# Patient Record
Sex: Female | Born: 1990 | Race: White | Hispanic: No | Marital: Single | Smoking: Never smoker
Health system: Southern US, Community
[De-identification: ages and names within clinical notes are randomized; demographics above are authoritative.]

## PROBLEM LIST (undated history)

## (undated) ENCOUNTER — Inpatient Hospital Stay: Payer: Self-pay

## (undated) DIAGNOSIS — F32A Depression, unspecified: Secondary | ICD-10-CM

## (undated) DIAGNOSIS — K219 Gastro-esophageal reflux disease without esophagitis: Secondary | ICD-10-CM

## (undated) DIAGNOSIS — J45909 Unspecified asthma, uncomplicated: Secondary | ICD-10-CM

## (undated) DIAGNOSIS — F419 Anxiety disorder, unspecified: Secondary | ICD-10-CM

## (undated) DIAGNOSIS — F329 Major depressive disorder, single episode, unspecified: Secondary | ICD-10-CM

## (undated) HISTORY — PX: WISDOM TOOTH EXTRACTION: SHX21

---

## 2012-10-01 ENCOUNTER — Ambulatory Visit: Payer: Self-pay | Admitting: Medical

## 2012-10-01 LAB — RAPID STREP-A WITH REFLX: Micro Text Report: NEGATIVE

## 2012-10-04 LAB — BETA STREP CULTURE(ARMC)

## 2014-12-16 ENCOUNTER — Ambulatory Visit: Payer: Self-pay | Admitting: Nurse Practitioner

## 2015-02-24 ENCOUNTER — Ambulatory Visit
Admit: 2015-02-24 | Disposition: A | Discharge status: Home / Self Care | Payer: Self-pay | Attending: Nurse Practitioner | Admitting: Nurse Practitioner

## 2015-05-27 ENCOUNTER — Observation Stay
Admission: EM | Admit: 2015-05-27 | Discharge: 2015-05-27 | Disposition: A | Discharge status: Home / Self Care | Payer: Medicaid Other | Attending: Obstetrics and Gynecology | Admitting: Obstetrics and Gynecology

## 2015-05-27 MED ORDER — LACTATED RINGERS IV SOLN: 500.0000 mL | INTRAVENOUS | Status: DC | PRN

## 2015-05-27 NOTE — OB Triage Note (Signed)
Pt. reports Braxton Hicks contractions that started several weeks ago and have intensified somewhat; now occurring sporadically. Feeling radiating upper abdominal tightness and increasing pressure. Denies bleeding or "major" LOF. Nitrazine negative.

## 2015-06-14 ENCOUNTER — Observation Stay
Admission: EM | Admit: 2015-06-14 | Discharge: 2015-06-14 | Disposition: A | Discharge status: Home / Self Care | Payer: Medicaid Other | Attending: Obstetrics and Gynecology | Admitting: Obstetrics and Gynecology

## 2015-06-14 DIAGNOSIS — O26893 Other specified pregnancy related conditions, third trimester: Principal | ICD-10-CM | POA: Insufficient documentation

## 2015-06-14 DIAGNOSIS — Z3A36 36 weeks gestation of pregnancy: Secondary | ICD-10-CM | POA: Insufficient documentation

## 2015-06-14 DIAGNOSIS — O26899 Other specified pregnancy related conditions, unspecified trimester: Secondary | ICD-10-CM

## 2015-06-14 DIAGNOSIS — R109 Unspecified abdominal pain: Secondary | ICD-10-CM | POA: Insufficient documentation

## 2015-06-14 HISTORY — DX: Anxiety disorder, unspecified: F41.9

## 2015-06-14 HISTORY — DX: Depression, unspecified: F32.A

## 2015-06-14 HISTORY — DX: Major depressive disorder, single episode, unspecified: F32.9

## 2015-06-14 NOTE — ED Notes (Signed)
Call to L&D, patient to room Obs 3. Pt transported to Obs 3.

## 2015-06-14 NOTE — OB Triage Note (Signed)
23 y.o. female presents today with complaint of losing mucus plug and belly pain .  States that this started last night. This has been going on for one day. States that this is a 5/10 on pain scale. She states that resting makes it better and moving makes it worse. At home has tried Tylenol to make it better.

## 2015-06-14 NOTE — OB Triage Provider Note (Signed)
TRIAGE VISIT with NST   Kellie Mejia is a 24 y.o. G1P0000. She is at [redacted]w[redacted]d gestation, presenting with signs of labor.  Indication: Contractions, lost mucus plug  S: Resting comfortably. Rare CTX, no VB. Active fetal movement. Concerned about being hot.  O:  BP 120/86 mmHg  Pulse 96  Ht  (1.626 m)  Wt 87.998 kg (194 lb)  BMI 33.28 kg/m2  LMP 10/01/2014 No results found for this or any previous visit (from the past 48 hour(s)).   Gen: NAD, AAOx3      Abd: FNTTP      Ext: Non-tender, Nonedmeatous    FHT: 120, mod var, +accels, no decels TOCO: quiet SVE: closed, thick and posterior  NST: Reactive. See FHT above for particulars.  A/P:  24 y.o. G1P0000 [redacted]w[redacted]d with lost mucus plug and concerns for labor.   Labor: not present.   Fetal Wellbeing: Reassuring Cat 1 tracing.  D/c home stable, precautions reviewed, follow-up as scheduled.

## 2015-07-01 ENCOUNTER — Encounter: Payer: Self-pay | Admitting: *Deleted

## 2015-07-01 ENCOUNTER — Observation Stay
Admission: EM | Admit: 2015-07-01 | Discharge: 2015-07-01 | Disposition: A | Discharge status: Home / Self Care | Payer: Medicaid Other | Attending: Obstetrics and Gynecology | Admitting: Obstetrics and Gynecology

## 2015-07-01 DIAGNOSIS — Z3A39 39 weeks gestation of pregnancy: Secondary | ICD-10-CM | POA: Diagnosis not present

## 2015-07-01 DIAGNOSIS — R102 Pelvic and perineal pain: Secondary | ICD-10-CM | POA: Diagnosis present

## 2015-07-01 DIAGNOSIS — O26893 Other specified pregnancy related conditions, third trimester: Secondary | ICD-10-CM | POA: Diagnosis not present

## 2015-07-01 DIAGNOSIS — R109 Unspecified abdominal pain: Secondary | ICD-10-CM | POA: Diagnosis present

## 2015-07-01 MED ORDER — ACETAMINOPHEN 325 MG PO TABS: 650.0000 mg | ORAL_TABLET | ORAL | Status: DC | PRN

## 2015-07-01 NOTE — Progress Notes (Signed)
Patient ID: Kellie Mejia, female   DOB: 1991-06-12, 24 y.o.   MRN: [id number] [name] Sep 17, 1991 G1 P0 [redacted]w[redacted]d presents for pelvic pressure and pain after walking yesterday  noLOF , no vaginal bleeding , O;Temp(Src) 98.6 F (37 C) (Oral)  Resp 18  Ht  (1.626 m)  Wt 201 lb (91.173 kg)  BMI 34.48 kg/m2  LMP 10/01/2014 ABDsoft NT CX tft by rn NST REACTIVE nst Labs: N/A A: ABD PAIN  No evidence of CTX / labor , reassuring fetal monitoring  P:d/c home  Precautions given

## 2015-07-01 NOTE — OB Triage Note (Signed)
patient states she went for a walk last night and has been hurting ever since.

## 2015-07-01 NOTE — Discharge Instructions (Signed)
Drink plenty of fluid and get plenty of rest. Call your provider for any other concerns °

## 2015-07-01 NOTE — Discharge Summary (Signed)
Patient discharged home, discharge instructions given, patient states understanding. Patient left floor in stable condition, denies any other needs at this time. Patient to keep next scheduled OB appointment 

## 2015-07-10 ENCOUNTER — Observation Stay
Admission: EM | Admit: 2015-07-10 | Discharge: 2015-07-10 | Disposition: A | Discharge status: Home / Self Care | Payer: Medicaid Other | Attending: Obstetrics and Gynecology | Admitting: Obstetrics and Gynecology

## 2015-07-10 ENCOUNTER — Encounter: Payer: Self-pay | Admitting: *Deleted

## 2015-07-10 DIAGNOSIS — O0993 Supervision of high risk pregnancy, unspecified, third trimester: Secondary | ICD-10-CM

## 2015-07-10 DIAGNOSIS — Z3490 Encounter for supervision of normal pregnancy, unspecified, unspecified trimester: Secondary | ICD-10-CM

## 2015-07-10 DIAGNOSIS — Z3A40 40 weeks gestation of pregnancy: Secondary | ICD-10-CM | POA: Diagnosis not present

## 2015-07-10 HISTORY — DX: Unspecified asthma, uncomplicated: J45.909

## 2015-07-10 HISTORY — DX: Gastro-esophageal reflux disease without esophagitis: K21.9

## 2015-07-10 NOTE — OB Triage Note (Signed)
Complaining of abdominal pressure starting around 3 PM yesterday. No leaking fluid, vaginal bleeding, or decreased movement.

## 2015-07-10 NOTE — OB Triage Provider Note (Signed)
TRIAGE VISIT with NST    Kellie Mejia is a 24 y.o. G1P0000. She is at [redacted]w[redacted]d gestation, presenting with signs of labor.  See RN note for further details  O: NST reviewed    EFM: 130 mod var, +accels, no decels Toco: 1 ctx  Per RN, 1cm/long/post  A/P:  24 y.o. G1P0000 40+2wk with early labor   Fetal Wellbeing: Reactive tracing.  D/c home stable, precautions reviewed, follow-up as scheduled.   PD IOL scheduled for Aug 25  Ala Dach, MD

## 2015-07-10 NOTE — Discharge Summary (Signed)
Reviewed discharge instructions with patient and significant other, both verbalized understanding. Copy of instructions given to patient including signs of labor and reasons to return to ER.

## 2015-07-16 ENCOUNTER — Encounter: Payer: Self-pay | Admitting: *Deleted

## 2015-07-16 ENCOUNTER — Inpatient Hospital Stay
Admission: RE | Admit: 2015-07-16 | Discharge: 2015-07-20 | DRG: 766 | Disposition: A | Discharge status: Home / Self Care | Payer: Medicaid Other | Attending: Obstetrics and Gynecology | Admitting: Obstetrics and Gynecology

## 2015-07-16 DIAGNOSIS — Z8249 Family history of ischemic heart disease and other diseases of the circulatory system: Secondary | ICD-10-CM | POA: Diagnosis not present

## 2015-07-16 DIAGNOSIS — O99344 Other mental disorders complicating childbirth: Secondary | ICD-10-CM | POA: Diagnosis present

## 2015-07-16 DIAGNOSIS — O9962 Diseases of the digestive system complicating childbirth: Secondary | ICD-10-CM | POA: Diagnosis present

## 2015-07-16 DIAGNOSIS — K219 Gastro-esophageal reflux disease without esophagitis: Secondary | ICD-10-CM | POA: Diagnosis present

## 2015-07-16 DIAGNOSIS — F419 Anxiety disorder, unspecified: Secondary | ICD-10-CM | POA: Diagnosis present

## 2015-07-16 DIAGNOSIS — O9952 Diseases of the respiratory system complicating childbirth: Secondary | ICD-10-CM | POA: Diagnosis present

## 2015-07-16 DIAGNOSIS — J45909 Unspecified asthma, uncomplicated: Secondary | ICD-10-CM | POA: Diagnosis present

## 2015-07-16 DIAGNOSIS — Z6835 Body mass index (BMI) 35.0-35.9, adult: Secondary | ICD-10-CM | POA: Diagnosis not present

## 2015-07-16 DIAGNOSIS — Z9889 Other specified postprocedural states: Secondary | ICD-10-CM

## 2015-07-16 DIAGNOSIS — Z3A41 41 weeks gestation of pregnancy: Secondary | ICD-10-CM | POA: Diagnosis present

## 2015-07-16 DIAGNOSIS — E669 Obesity, unspecified: Secondary | ICD-10-CM | POA: Diagnosis present

## 2015-07-16 DIAGNOSIS — O480 Post-term pregnancy: Secondary | ICD-10-CM | POA: Diagnosis present

## 2015-07-16 DIAGNOSIS — F329 Major depressive disorder, single episode, unspecified: Secondary | ICD-10-CM | POA: Diagnosis present

## 2015-07-16 LAB — OB RESULTS CONSOLE GC/CHLAMYDIA
Chlamydia: NEGATIVE
Gonorrhea: NEGATIVE

## 2015-07-16 LAB — OB RESULTS CONSOLE ABO/RH: RH TYPE: POSITIVE

## 2015-07-16 LAB — OB RESULTS CONSOLE RPR: RPR: NONREACTIVE

## 2015-07-16 LAB — OB RESULTS CONSOLE GBS: STREP GROUP B AG: NEGATIVE

## 2015-07-16 LAB — OB RESULTS CONSOLE HIV ANTIBODY (ROUTINE TESTING): HIV: NONREACTIVE

## 2015-07-16 LAB — OB RESULTS CONSOLE VARICELLA ZOSTER ANTIBODY, IGG: VARICELLA IGG: IMMUNE

## 2015-07-16 LAB — OB RESULTS CONSOLE HEPATITIS B SURFACE ANTIGEN: HEP B S AG: NEGATIVE

## 2015-07-16 LAB — OB RESULTS CONSOLE RUBELLA ANTIBODY, IGM: Rubella: IMMUNE

## 2015-07-16 MED ORDER — CITRIC ACID-SODIUM CITRATE 334-500 MG/5ML PO SOLN
30.0000 mL | ORAL | Status: DC | PRN
Start: 2015-07-16 — End: 2015-07-17
  Administered 2015-07-17: 30 mL via ORAL

## 2015-07-16 MED ORDER — LACTATED RINGERS IV SOLN: 500.0000 mL | INTRAVENOUS | Status: DC | PRN

## 2015-07-16 MED ORDER — BUTORPHANOL TARTRATE 1 MG/ML IJ SOLN
1.0000 mg | INTRAMUSCULAR | Status: DC | PRN
  Administered 2015-07-17: 2 mg via INTRAVENOUS

## 2015-07-16 MED ORDER — TERBUTALINE SULFATE 1 MG/ML IJ SOLN: 0.2500 mg | Freq: Once | INTRAMUSCULAR | Status: DC | PRN

## 2015-07-16 MED ORDER — ONDANSETRON HCL 4 MG/2ML IJ SOLN
4.0000 mg | Freq: Four times a day (QID) | INTRAMUSCULAR | Status: DC | PRN
Start: 2015-07-16 — End: 2015-07-17

## 2015-07-16 MED ORDER — LIDOCAINE HCL (PF) 1 % IJ SOLN: 30.0000 mL | INTRAMUSCULAR | Status: DC | PRN

## 2015-07-16 MED ORDER — OXYTOCIN 40 UNITS IN LACTATED RINGERS INFUSION - SIMPLE MED
62.5000 mL/h | INTRAVENOUS | Status: DC
  Filled 2015-07-16: qty 1000

## 2015-07-16 MED ORDER — LACTATED RINGERS IV SOLN
INTRAVENOUS | Status: DC
  Administered 2015-07-17: 125 mL/h via INTRAVENOUS
  Administered 2015-07-17: 17:00:00 via INTRAVENOUS

## 2015-07-16 MED ORDER — ACETAMINOPHEN 325 MG PO TABS: 650.0000 mg | ORAL_TABLET | ORAL | Status: DC | PRN

## 2015-07-16 MED ORDER — DINOPROSTONE 10 MG VA INST
10.0000 mg | VAGINAL_INSERT | Freq: Once | VAGINAL | Status: AC
  Administered 2015-07-17: 10 mg via VAGINAL
  Filled 2015-07-16: qty 1

## 2015-07-16 MED ORDER — OXYTOCIN BOLUS FROM INFUSION: 250.0000 mL | INTRAVENOUS | Status: DC

## 2015-07-17 ENCOUNTER — Encounter: Payer: Self-pay | Admitting: Anesthesiology

## 2015-07-17 ENCOUNTER — Inpatient Hospital Stay: Payer: Medicaid Other | Admitting: Anesthesiology

## 2015-07-17 ENCOUNTER — Encounter
Admission: RE | Disposition: A | Discharge status: Home / Self Care | Payer: Self-pay | Source: Home / Self Care | Attending: Obstetrics and Gynecology

## 2015-07-17 ENCOUNTER — Other Ambulatory Visit: Payer: Self-pay | Admitting: Obstetrics and Gynecology

## 2015-07-17 DIAGNOSIS — Z9889 Other specified postprocedural states: Secondary | ICD-10-CM

## 2015-07-17 LAB — CBC
HCT: 36.5 % (ref 35.0–47.0)
HEMOGLOBIN: 12.2 g/dL (ref 12.0–16.0)
MCH: 29.1 pg (ref 26.0–34.0)
MCHC: 33.5 g/dL (ref 32.0–36.0)
MCV: 86.8 fL (ref 80.0–100.0)
PLATELETS: 245 10*3/uL (ref 150–440)
RBC: 4.2 MIL/uL (ref 3.80–5.20)
RDW: 13.7 % (ref 11.5–14.5)
WBC: 13.6 10*3/uL — AB (ref 3.6–11.0)

## 2015-07-17 LAB — TYPE AND SCREEN
ABO/RH(D): A POS
Antibody Screen: NEGATIVE

## 2015-07-17 LAB — CHLAMYDIA/NGC RT PCR (ARMC ONLY)
CHLAMYDIA TR: NOT DETECTED
N gonorrhoeae: NOT DETECTED

## 2015-07-17 SURGERY — Surgical Case
Anesthesia: Epidural | Site: Abdomen | Wound class: Clean Contaminated

## 2015-07-17 MED ORDER — DIBUCAINE 1 % RE OINT: 1.0000 "application " | TOPICAL_OINTMENT | RECTAL | Status: DC | PRN

## 2015-07-17 MED ORDER — ONDANSETRON 4 MG PO TBDP
4.0000 mg | ORAL_TABLET | Freq: Four times a day (QID) | ORAL | Status: DC | PRN
  Filled 2015-07-17: qty 1

## 2015-07-17 MED ORDER — METHYLERGONOVINE MALEATE 0.2 MG/ML IJ SOLN
INTRAMUSCULAR | Status: AC
  Filled 2015-07-17: qty 1

## 2015-07-17 MED ORDER — TERBUTALINE SULFATE 1 MG/ML IJ SOLN
INTRAMUSCULAR | Status: AC
  Filled 2015-07-17: qty 1

## 2015-07-17 MED ORDER — FENTANYL 2.5 MCG/ML W/ROPIVACAINE 0.2% IN NS 100 ML EPIDURAL INFUSION (ARMC-ANES)
EPIDURAL | Status: DC | PRN
  Administered 2015-07-17: 9 mL/h via EPIDURAL

## 2015-07-17 MED ORDER — FENTANYL CITRATE (PF) 100 MCG/2ML IJ SOLN: 25.0000 ug | INTRAMUSCULAR | Status: DC | PRN

## 2015-07-17 MED ORDER — CEFAZOLIN SODIUM-DEXTROSE 2-3 GM-% IV SOLR
INTRAVENOUS | Status: AC
  Administered 2015-07-17: 2000 mg
  Filled 2015-07-17: qty 50

## 2015-07-17 MED ORDER — BUPIVACAINE HCL (PF) 0.5 % IJ SOLN
INTRAMUSCULAR | Status: AC
  Filled 2015-07-17: qty 30

## 2015-07-17 MED ORDER — ONDANSETRON HCL 4 MG/2ML IJ SOLN: 4.0000 mg | Freq: Four times a day (QID) | INTRAMUSCULAR | Status: DC | PRN

## 2015-07-17 MED ORDER — ACETAMINOPHEN 325 MG PO TABS: 650.0000 mg | ORAL_TABLET | ORAL | Status: DC | PRN

## 2015-07-17 MED ORDER — BUPIVACAINE HCL 0.5 % IJ SOLN
INTRAMUSCULAR | Status: DC | PRN
  Administered 2015-07-17: 10 mL

## 2015-07-17 MED ORDER — BUTORPHANOL TARTRATE 1 MG/ML IJ SOLN
INTRAMUSCULAR | Status: AC
  Administered 2015-07-17: 2 mg via INTRAVENOUS
  Filled 2015-07-17: qty 2

## 2015-07-17 MED ORDER — DIPHENHYDRAMINE HCL 25 MG PO CAPS
25.0000 mg | ORAL_CAPSULE | Freq: Four times a day (QID) | ORAL | Status: DC | PRN
  Administered 2015-07-19: 25 mg via ORAL
  Filled 2015-07-17: qty 1

## 2015-07-17 MED ORDER — OXYTOCIN 40 UNITS IN LACTATED RINGERS INFUSION - SIMPLE MED
1.0000 m[IU]/min | INTRAVENOUS | Status: DC
  Administered 2015-07-17: 2 m[IU]/min via INTRAVENOUS
  Administered 2015-07-17 (×2): 1 m[IU]/min via INTRAVENOUS

## 2015-07-17 MED ORDER — TETANUS-DIPHTH-ACELL PERTUSSIS 5-2.5-18.5 LF-MCG/0.5 IM SUSP: 0.5000 mL | Freq: Once | INTRAMUSCULAR | Status: DC

## 2015-07-17 MED ORDER — BUTORPHANOL TARTRATE 1 MG/ML IJ SOLN: 1.0000 mg | INTRAMUSCULAR | Status: DC | PRN

## 2015-07-17 MED ORDER — OXYTOCIN 40 UNITS IN LACTATED RINGERS INFUSION - SIMPLE MED
62.5000 mL/h | INTRAVENOUS | Status: DC
  Administered 2015-07-17: 62.5 mL/h via INTRAVENOUS

## 2015-07-17 MED ORDER — SIMETHICONE 80 MG PO CHEW: 80.0000 mg | CHEWABLE_TABLET | ORAL | Status: DC | PRN

## 2015-07-17 MED ORDER — BUPIVACAINE 0.25 % ON-Q PUMP DUAL CATH 400 ML
INJECTION | Status: AC
  Filled 2015-07-17: qty 400

## 2015-07-17 MED ORDER — EPHEDRINE SULFATE 50 MG/ML IJ SOLN
5.0000 mg | Freq: Once | INTRAMUSCULAR | Status: AC
  Administered 2015-07-17: 5 mg via INTRAVENOUS

## 2015-07-17 MED ORDER — BUPIVACAINE HCL (PF) 0.25 % IJ SOLN
INTRAMUSCULAR | Status: DC | PRN
  Administered 2015-07-17: 3 mL
  Administered 2015-07-17: 5 mL

## 2015-07-17 MED ORDER — SIMETHICONE 80 MG PO CHEW
80.0000 mg | CHEWABLE_TABLET | Freq: Three times a day (TID) | ORAL | Status: DC
  Administered 2015-07-18 – 2015-07-20 (×5): 80 mg via ORAL
  Filled 2015-07-17 (×6): qty 1

## 2015-07-17 MED ORDER — LACTATED RINGERS IV SOLN
INTRAVENOUS | Status: DC
  Administered 2015-07-18: 15:00:00 via INTRAVENOUS

## 2015-07-17 MED ORDER — FENTANYL 2.5 MCG/ML W/ROPIVACAINE 0.2% IN NS 100 ML EPIDURAL INFUSION (ARMC-ANES)
EPIDURAL | Status: AC
  Administered 2015-07-17: 250 ug
  Filled 2015-07-17: qty 100

## 2015-07-17 MED ORDER — OXYCODONE-ACETAMINOPHEN 5-325 MG PO TABS
2.0000 | ORAL_TABLET | ORAL | Status: DC | PRN
  Administered 2015-07-18: 2 via ORAL
  Filled 2015-07-17: qty 2

## 2015-07-17 MED ORDER — IBUPROFEN 600 MG PO TABS
600.0000 mg | ORAL_TABLET | Freq: Four times a day (QID) | ORAL | Status: DC
  Administered 2015-07-17 – 2015-07-20 (×11): 600 mg via ORAL
  Filled 2015-07-17 (×11): qty 1

## 2015-07-17 MED ORDER — MENTHOL 3 MG MT LOZG: 1.0000 | LOZENGE | OROMUCOSAL | Status: DC | PRN

## 2015-07-17 MED ORDER — PRENATAL MULTIVITAMIN CH
1.0000 | ORAL_TABLET | Freq: Every day | ORAL | Status: DC
  Administered 2015-07-18 – 2015-07-20 (×3): 1 via ORAL
  Filled 2015-07-17 (×3): qty 1

## 2015-07-17 MED ORDER — TERBUTALINE SULFATE 1 MG/ML IJ SOLN
0.2500 mg | Freq: Once | INTRAMUSCULAR | Status: AC | PRN
  Administered 2015-07-17: 0.25 mg via SUBCUTANEOUS

## 2015-07-17 MED ORDER — SENNOSIDES-DOCUSATE SODIUM 8.6-50 MG PO TABS
2.0000 | ORAL_TABLET | ORAL | Status: DC
  Administered 2015-07-17 – 2015-07-19 (×3): 2 via ORAL
  Filled 2015-07-17 (×3): qty 2

## 2015-07-17 MED ORDER — BUPIVACAINE 0.25 % ON-Q PUMP DUAL CATH 400 ML: INJECTION | Status: DC

## 2015-07-17 MED ORDER — OXYTOCIN 10 UNIT/ML IJ SOLN
40.0000 [IU] | INTRAVENOUS | Status: DC | PRN
  Administered 2015-07-17: 20 [IU] via INTRAVENOUS

## 2015-07-17 MED ORDER — OXYTOCIN 40 UNITS IN LACTATED RINGERS INFUSION - SIMPLE MED
INTRAVENOUS | Status: AC
  Administered 2015-07-17: 62.5 mL/h via INTRAVENOUS
  Filled 2015-07-17: qty 1000

## 2015-07-17 MED ORDER — WITCH HAZEL-GLYCERIN EX PADS: 1.0000 "application " | MEDICATED_PAD | CUTANEOUS | Status: DC | PRN

## 2015-07-17 MED ORDER — ZOLPIDEM TARTRATE 5 MG PO TABS: 5.0000 mg | ORAL_TABLET | Freq: Every evening | ORAL | Status: DC | PRN

## 2015-07-17 MED ORDER — OXYTOCIN 40 UNITS IN LACTATED RINGERS INFUSION - SIMPLE MED: 62.5000 mL/h | INTRAVENOUS | Status: DC

## 2015-07-17 MED ORDER — EPHEDRINE SULFATE 50 MG/ML IJ SOLN
INTRAMUSCULAR | Status: AC
  Administered 2015-07-17: 5 mg via INTRAVENOUS
  Filled 2015-07-17: qty 1

## 2015-07-17 MED ORDER — CITRIC ACID-SODIUM CITRATE 334-500 MG/5ML PO SOLN
ORAL | Status: AC
  Administered 2015-07-17: 30 mL via ORAL
  Filled 2015-07-17: qty 15

## 2015-07-17 MED ORDER — LIDOCAINE-EPINEPHRINE (PF) 1.5 %-1:200000 IJ SOLN
INTRAMUSCULAR | Status: DC | PRN
  Administered 2015-07-17: 15 mL via PERINEURAL
  Administered 2015-07-17: 3 mL via PERINEURAL

## 2015-07-17 MED ORDER — ONDANSETRON HCL 4 MG/2ML IJ SOLN
4.0000 mg | Freq: Once | INTRAMUSCULAR | Status: AC | PRN
  Administered 2015-07-17: 4 mg via INTRAVENOUS
  Filled 2015-07-17: qty 2

## 2015-07-17 MED ORDER — CEFAZOLIN SODIUM-DEXTROSE 2-3 GM-% IV SOLR
INTRAVENOUS | Status: DC | PRN
  Administered 2015-07-17: 2 g via INTRAVENOUS

## 2015-07-17 MED ORDER — MORPHINE SULFATE (PF) 0.5 MG/ML IJ SOLN
INTRAMUSCULAR | Status: DC | PRN
  Administered 2015-07-17: 1 mg via EPIDURAL

## 2015-07-17 MED ORDER — SIMETHICONE 80 MG PO CHEW
80.0000 mg | CHEWABLE_TABLET | ORAL | Status: DC
  Administered 2015-07-17 – 2015-07-19 (×3): 80 mg via ORAL
  Filled 2015-07-17 (×2): qty 1

## 2015-07-17 MED ORDER — LANOLIN HYDROUS EX OINT
1.0000 | TOPICAL_OINTMENT | CUTANEOUS | Status: DC | PRN
Start: 2015-07-17 — End: 2015-07-20

## 2015-07-17 MED ORDER — SODIUM CHLORIDE 0.9 % IJ SOLN
INTRAMUSCULAR | Status: AC
  Filled 2015-07-17: qty 10

## 2015-07-17 SURGICAL SUPPLY — 22 items
BARRIER ADHS 3X4 INTERCEED (GAUZE/BANDAGES/DRESSINGS) ×3 IMPLANT
CANISTER SUCT 3000ML (MISCELLANEOUS) ×3 IMPLANT
CATH KIT ON-Q SILVERSOAK 5IN (CATHETERS) ×6 IMPLANT
CHLORAPREP W/TINT 26ML (MISCELLANEOUS) ×6 IMPLANT
DRSG TELFA 3X8 NADH (GAUZE/BANDAGES/DRESSINGS) ×3 IMPLANT
ELECT CAUTERY BLADE 6.4 (BLADE) ×3 IMPLANT
GAUZE SPONGE 4X4 12PLY STRL (GAUZE/BANDAGES/DRESSINGS) ×3 IMPLANT
GLOVE BIO SURGEON STRL SZ8 (GLOVE) ×15 IMPLANT
GOWN STRL REUS W/ TWL LRG LVL3 (GOWN DISPOSABLE) ×2 IMPLANT
GOWN STRL REUS W/ TWL XL LVL3 (GOWN DISPOSABLE) ×1 IMPLANT
GOWN STRL REUS W/TWL LRG LVL3 (GOWN DISPOSABLE) ×4
GOWN STRL REUS W/TWL XL LVL3 (GOWN DISPOSABLE) ×2
NS IRRIG 1000ML POUR BTL (IV SOLUTION) ×3 IMPLANT
PACK C SECTION AR (MISCELLANEOUS) ×3 IMPLANT
PAD GROUND ADULT SPLIT (MISCELLANEOUS) ×3 IMPLANT
PAD OB MATERNITY 4.3X12.25 (PERSONAL CARE ITEMS) ×3 IMPLANT
PAD PREP 24X41 OB/GYN DISP (PERSONAL CARE ITEMS) ×3 IMPLANT
STAPLER INSORB 30 2030 C-SECTI (MISCELLANEOUS) ×3 IMPLANT
STRAP SAFETY BODY (MISCELLANEOUS) ×3 IMPLANT
SUT CHROMIC 1 CTX 36 (SUTURE) ×9 IMPLANT
SUT PLAIN GUT 0 (SUTURE) ×6 IMPLANT
SUT VIC AB 0 CT1 36 (SUTURE) ×6 IMPLANT

## 2015-07-17 NOTE — Anesthesia Preprocedure Evaluation (Signed)
Anesthesia Evaluation  Patient identified by MRN, date of birth, ID band Patient awake    Reviewed: Allergy & Precautions, NPO status , Patient's Chart, lab work & pertinent test results  History of Anesthesia Complications Negative for: history of anesthetic complications  Airway Mallampati: II       Dental no notable dental hx.    Pulmonary asthma ,          Cardiovascular Normal cardiovascular exam    Neuro/Psych    GI/Hepatic GERD-  ,  Endo/Other    Renal/GU      Musculoskeletal   Abdominal   Peds  Hematology   Anesthesia Other Findings   Reproductive/Obstetrics (+) Pregnancy                             Anesthesia Physical Anesthesia Plan  ASA: II  Anesthesia Plan: Epidural   Post-op Pain Management:    Induction:   Airway Management Planned:   Additional Equipment:   Intra-op Plan:   Post-operative Plan:   Informed Consent: I have reviewed the patients History and Physical, chart, labs and discussed the procedure including the risks, benefits and alternatives for the proposed anesthesia with the patient or authorized representative who has indicated his/her understanding and acceptance.     Plan Discussed with: CRNA  Anesthesia Plan Comments:         Anesthesia Quick Evaluation

## 2015-07-17 NOTE — H&P (Signed)
Kellie Mejia is a 24 y.o. female presenting for IOL due to postdates with [facility] at [facility] clinic with LMP of 10/01/14 & EDD of 07/08/15. Pt has a hx of anxiety, irreg menses, Obesity, and is a G1P0. Cervidil used during the night at 0200am but, fell out at 0600am. Pt is uncomfortable for UC's.  AROM for mod clear at 0854am and iUPC inserted. No Quad screen seen. [place] not found on chart.  Maternal Medical History:  Reason for admission: Contractions.   Contractions: Frequency: regular.   Perceived severity is mild.    Fetal activity: Perceived fetal activity is normal.    Prenatal complications: No bleeding, cholelithiasis, HIV, PIH, infection, IUGR, nephrolithiasis, oligohydramnios, placental abnormality, polyhydramnios, pre-eclampsia, preterm labor, substance abuse, thrombocytopenia or thrombophilia.     OB History    Gravida Para Term Preterm AB TAB SAB Ectopic Multiple Living       Past Medical History  Diagnosis Date  . Anxiety   . Depression   . Asthma     excercise induced  . GERD (gastroesophageal reflux disease)    Past Surgical History  Procedure Laterality Date  . Wisdom tooth extraction     Family History: family history includes Hypertension in her maternal grandfather. Social History:  reports that she has never smoked. She has never used smokeless tobacco. She reports that she does not drink alcohol or use illicit drugs.   Prenatal Transfer Tool  Maternal Diabetes: No Genetic Screening not found Maternal Ultrasounds/Referrals: Normal Fetal Ultrasounds or other Referrals:  None Maternal Substance Abuse:  No Significant Maternal Medications:  None Significant Maternal Lab Results:  None Other Comments:  None  ROS  Dilation: 1 Effacement (%): 70 Station: -3, Ballotable Exam by:: HFC Blood pressure 128/88, pulse 80, temperature 97.9 F (36.6 C), temperature source Oral, resp. rate 18, height  (1.626 m), weight 93.441 kg (206  lb), last menstrual period 10/01/2014. Exam Physical Exam  Gen: 24 yo white female in NAD Heart: S1S2, RRR, no M/R/G. Lungs: CTA bilat, no W/R/R.  Abd: Gravid, EFW 7#6. Vtx by exam Cx: 3/90/vtx-2 Extrems: DTR's 2+/0 ASSESSMENT/PLAN A: IUP at 40 4/7 weeks 2. GBS neg P: 1. Disc the risks, benefits and alternatives of IOL and pt agrees with risks including infection, bleeding, uterine or fetal intolerance. Pt accepts risks and agrees to proceed.  2. Continue to monitor UC & FHT's. Prenatal labs: ABO, Rh: --/--/A POS (08/26 0246) Antibody: NEG (08/26 0246) Rubella: Immune (08/25 0000) RPR: Nonreactive (08/25 0000)  HBsAg: Negative (08/25 0000)  HIV: Non-reactive (08/25 0000)  GBS: Negative (08/25 0000)      Milon Score W 07/17/2015, 9:01 AM

## 2015-07-17 NOTE — Progress Notes (Signed)
Patient ID: Kellie Mejia, female   DOB: [birth year], 24 y.o.   MRN: [id number] CTSP due to fetal decel to 60 x 3.5 mins with recovery after being in knee-chest, O2 and IV hydration given. Terb x 1 dose. FHR is now 145 with mod variability. No further decels noted. Dr Feliberto Gottron called but, doing a Novasure. Will update when arriving back to office. Pt aware that she may need a LTCS.

## 2015-07-17 NOTE — Anesthesia Procedure Notes (Signed)
Epidural Patient location during procedure: OB Start time: 07/17/2015 11:57 AM End time: 07/17/2015 12:25 PM  Staffing Resident/CRNA: Kellie Mejia Performed by: resident/CRNA   Preanesthetic Checklist Completed: patient identified, site marked, surgical consent, pre-op evaluation, timeout performed, IV checked, risks and benefits discussed and monitors and equipment checked  Epidural Patient position: sitting Prep: Betadine Patient monitoring: heart rate, cardiac monitor, continuous pulse ox and blood pressure Approach: midline Location: L3-L4 Injection technique: LOR air  Needle:  Needle type: Tuohy  Needle gauge: 18 G Needle length: 9 cm Needle insertion depth: 4 cm Catheter type: closed end flexible Catheter size: 19 Gauge Catheter at skin depth: 9 cm Test dose: negative and 1.5% lidocaine with Epi 1:200 K  Assessment Events: blood not aspirated, injection not painful, no injection resistance, negative IV test and no paresthesia  Additional Notes Reason for block:procedure for pain

## 2015-07-17 NOTE — Progress Notes (Signed)
Kellie Mejia is a 24 y.o. G1P0000 at [redacted]w[redacted]d by LMP admitted for induction of labor due to Post dates..  Subjective: Does not feel any UC's yet  Objective: BP 95/65 mmHg  Pulse 133  Temp(Src) 97.9 F (36.6 C) (Oral)  Resp 18  Ht  (1.626 m)  Wt 93.441 kg (206 lb)  BMI 35.34 kg/m2  SpO2 100%  LMP 10/01/2014      FHT:  FHR: 145, minimal variability, 1 late decel x 2 mins,  bpm, variability: minimal ,  accelerations:  Present,  decelerations:  Present per above UC:   regular, every 5  minutes SVE:   Dilation: 3 Effacement (%): 90 Station: -2 Exam by:: Kellie Mejia  Labs: Lab Results  Component Value Date   WBC 13.6* 07/17/2015   HGB 12.2 07/17/2015   HCT 36.5 07/17/2015   MCV 86.8 07/17/2015   PLT 245 07/17/2015    Assessment / Plan: Fetal decel (late with 1 UC, will continue to observe for any further decels and if fetus is non-reassuring, call a LTCS  Labor: No cx progress at present Preeclampsia:  none Fetal Wellbeing:  Category II Pain Control:  Epidural I/D:  none Anticipated MOD:  LTCS if any more decels occur or fetus is non-reassuring. +accels now  Kellie Mejia 07/17/2015, 5:47 PM

## 2015-07-17 NOTE — Progress Notes (Signed)
Kellie Mejia is a 24 y.o. G1P0000 at [redacted]w[redacted]d by LMP admitted for induction of labor due to Post dates. .  Subjective:  No pain now Objective: BP 128/88 mmHg  Pulse 80  Temp(Src) 97.9 F (36.6 C) (Oral)  Resp 18  Ht  (1.626 m)  Wt 93.441 kg (206 lb)  BMI 35.34 kg/m2  LMP 10/01/2014      FHT:  120'S, +ACCELS, 2 DECELS TO 90 WITH RECOVERY, Cat 2 UC:   regular, every 5  minutes SVE:   Dilation: 3 Effacement (%): 90 Station: -2 Exam by:: Raneem Mendolia  Labs: Lab Results  Component Value Date   WBC 13.6* 07/17/2015   HGB 12.2 07/17/2015   HCT 36.5 07/17/2015   MCV 86.8 07/17/2015   PLT 245 07/17/2015    Assessment / Plan: Induction of labor due to postterm,  progressing well on pitocin  Labor: Continue Pitocin per protocol Preeclampsia:  none Fetal Wellbeing:  Category II Pain Control:  Labor support without medications I/D:  n/a Anticipated MOD:  NSVD  [name] 07/17/2015, 1:26 PM

## 2015-07-17 NOTE — Progress Notes (Signed)
Patient ID: Kellie Mejia, female   DOB: Sep 10, 1991, 24 y.o.   MRN: [id number] S/p LTCS at 1920 viable female apgars 8/9 wt #7/8 ( 3390 gm) On q pump placed  ebl 300 cc  IOF 1000 cc No complications

## 2015-07-17 NOTE — Anesthesia Preprocedure Evaluation (Signed)
Anesthesia Evaluation  Patient identified by MRN, date of birth, ID band Patient awake    Reviewed: Allergy & Precautions, NPO status , Patient's Chart, lab work & pertinent test results, reviewed documented beta blocker date and time   Airway Mallampati: II  TM Distance: >3 FB     Dental  (+) Chipped   Pulmonary asthma ,          Cardiovascular     Neuro/Psych PSYCHIATRIC DISORDERS Anxiety Depression    GI/Hepatic GERD-  Medicated and Controlled,  Endo/Other    Renal/GU      Musculoskeletal   Abdominal   Peds  Hematology   Anesthesia Other Findings   Reproductive/Obstetrics                             Anesthesia Physical Anesthesia Plan  ASA: II  Anesthesia Plan: Epidural   Post-op Pain Management:    Induction:   Airway Management Planned: Nasal Cannula  Additional Equipment:   Intra-op Plan:   Post-operative Plan:   Informed Consent: I have reviewed the patients History and Physical, chart, labs and discussed the procedure including the risks, benefits and alternatives for the proposed anesthesia with the patient or authorized representative who has indicated his/her understanding and acceptance.     Plan Discussed with: CRNA  Anesthesia Plan Comments:         Anesthesia Quick Evaluation

## 2015-07-17 NOTE — Progress Notes (Signed)
Patient ID: Kellie Mejia, female   DOB: [birth year], 24 y.o.   MRN: [id number] Reviewed fetal stripe from 1300 onwards. Pt has had 8 significant decels since 1308 . cx 3 cm .  CTX q 6 minutes . I have explained the findings to pt and her husband . I have recommended a Primary LTCS and on q pump  For fetal intolerance to labor .  Couple agrees>

## 2015-07-17 NOTE — Transfer of Care (Signed)
Immediate Anesthesia Transfer of Care Note  Patient: Kellie Mejia  Procedure(s) Performed: Procedure(s): CESAREAN SECTION (N/A)  Patient Location: PACU  Anesthesia Type:Epidural  Level of Consciousness: awake, alert  and oriented  Airway & Oxygen Therapy: Patient Spontanous Breathing  Post-op Assessment: Post -op Vital signs reviewed and stable and Patient moving all extremities  Post vital signs: Reviewed and stable  Last Vitals:  Filed Vitals:   07/17/15 2007  BP: 95/40  Pulse: 115  Temp: 36.8 C  Resp: 18    Complications: No apparent anesthesia complications

## 2015-07-18 LAB — CBC
HCT: 35.1 % (ref 35.0–47.0)
HEMOGLOBIN: 11.6 g/dL — AB (ref 12.0–16.0)
MCH: 28.6 pg (ref 26.0–34.0)
MCHC: 33 g/dL (ref 32.0–36.0)
MCV: 86.8 fL (ref 80.0–100.0)
PLATELETS: 193 10*3/uL (ref 150–440)
RBC: 4.05 MIL/uL (ref 3.80–5.20)
RDW: 13.7 % (ref 11.5–14.5)
WBC: 15 10*3/uL — ABNORMAL HIGH (ref 3.6–11.0)

## 2015-07-18 LAB — RPR: RPR: NONREACTIVE

## 2015-07-18 NOTE — Progress Notes (Signed)
Post Partum Day 1/POD#1 s/P LTCS Subjective: C/o "feeling tired today".   Objective: Blood pressure 105/67, pulse 74, temperature 97.8 F (36.6 C), temperature source Oral, resp. rate 18, height  (1.626 m), weight 206 lb (93.441 kg), last menstrual period 10/01/2014, SpO2 97 %, unknown if currently breastfeeding.  Physical Exam:  General: alert, awake and oriented x 3 Heart: S1S2, RRR, No M/R/G.  Lungs: CTA bilat, no W/R/R Lochia: appropriate, mod rubra noted Taking po well, Voids well.  Uterine Fundus: firm Incision: healing well, Dressing intact with some old bloody dc under the dressing visible.  DVT Evaluation: No evidence of DVT seen on physical exam.   Recent Labs  07/17/15 0239 07/18/15 0534  HGB 12.2 11.6*  HCT 36.5 35.1  WBC 13.6* 15.0*  PLT 245 193    Assessment/Plan: A: POD#1 kTCS P: Continue orders.   LOS: 2 days   [name] 07/18/2015, 11:48 AM

## 2015-07-18 NOTE — Anesthesia Post-op Follow-up Note (Signed)
  Anesthesia Pain Follow-up Note  Patient: Kellie Mejia  Day #: 1  Date of Follow-up: 07/18/2015 Time: 9:23 AM  Last Vitals:  Filed Vitals:   07/18/15 0735  BP: 105/67  Pulse: 74  Temp: 36.6 C  Resp: 18    Level of Consciousness: alert  Pain: none   Side Effects:None  Catheter Site Exam:clean  Plan: D/C from anesthesia care  KEPHART,WILLIAM K

## 2015-07-18 NOTE — Anesthesia Postprocedure Evaluation (Signed)
  Anesthesia Post-op Note  Patient: Kellie Mejia  Procedure(s) Performed: CLE Anesthesia type:Epidural  Patient location: PACU  Post pain: Pain level controlled  Post assessment: Post-op Vital signs reviewed, Patient's Cardiovascular Status Stable, Respiratory Function Stable, Patent Airway and No signs of Nausea or vomiting  Post vital signs: Reviewed and stable  Last Vitals:  Filed Vitals:   07/18/15 0735  BP: 105/67  Pulse: 74  Temp: 36.6 C  Resp: 18    Level of consciousness: awake, alert  and patient cooperative  Complications: No apparent anesthesia complications

## 2015-07-18 NOTE — Anesthesia Postprocedure Evaluation (Signed)
  Anesthesia Post-op Note  Patient: Kellie Mejia  Procedure(s) Performed: Procedure(s): CESAREAN SECTION (N/A)  Anesthesia type:Epidural  Patient location: PACU  Post pain: Pain level controlled  Post assessment: Post-op Vital signs reviewed, Patient's Cardiovascular Status Stable, Respiratory Function Stable, Patent Airway and No signs of Nausea or vomiting  Post vital signs: Reviewed and stable  Last Vitals:  Filed Vitals:   07/18/15 0735  BP: 105/67  Pulse: 74  Temp: 36.6 C  Resp: 18    Level of consciousness: awake, alert  and patient cooperative  Complications: No apparent anesthesia complications

## 2015-07-19 NOTE — Progress Notes (Addendum)
Post Partum Day 2/POD#2 Subjective: no complaints, appropriate tenderness of the incision Breastfeeding infant well  Objective: Blood pressure 117/74, pulse 78, temperature 98.4 F (36.9 C), temperature source Oral, resp. rate 18, height  (1.626 m), weight 206 lb (93.441 kg), last menstrual period 10/01/2014, SpO2 99 %, unknown if currently breastfeeding.  Physical Exam:  General: alert, Awake and oriented x 3 Heart: S1S2, RRR, No M/R/G Lungs: CTA bilat, No W/R/R.  Lochia: appropriate, no clots Uterine Fundus: firm, U-2 Incision: C/D/I, O-Q pump intact DVT Evaluation: No evidence of DVT seen on physical exam. Taking po well and voiding well  Recent Labs  07/17/15 0239 07/18/15 0534  HGB 12.2 11.6*  HCT 36.5 35.1  WBC 13.6* 15.0*  PLT 245 193    Assessment/Plan: Plan for discharge tomorrow  Tdap done at 27 weeks  Varicella immune P: DC in am Continue to breastfeed FU in 1 week with office   LOS: 3 days   [name] 07/19/2015, 12:51 PM

## 2015-07-20 ENCOUNTER — Encounter: Payer: Self-pay | Admitting: Anesthesiology

## 2015-07-20 MED ORDER — IBUPROFEN 600 MG PO TABS: 600.0000 mg | ORAL_TABLET | Freq: Four times a day (QID) | ORAL | Status: DC

## 2015-07-20 MED ORDER — DOCUSATE SODIUM 100 MG PO CAPS
100.0000 mg | ORAL_CAPSULE | Freq: Two times a day (BID) | ORAL | Status: DC
Start: 2015-07-20 — End: 2016-03-01

## 2015-07-20 MED ORDER — OXYCODONE-ACETAMINOPHEN 5-325 MG PO TABS: 1.0000 | ORAL_TABLET | Freq: Four times a day (QID) | ORAL | Status: DC | PRN

## 2015-07-20 NOTE — Progress Notes (Signed)
Patient discharged home with spouse with infant in arms escorted out in wheelchair

## 2015-07-20 NOTE — Op Note (Deleted)
[name], SPAID NO.:  [id number]  MEDICAL RECORD NO.:  [id number]  LOCATION:  344A                         FACILITY:  [facility]  PHYSICIAN:  Jennell Corner, MDDATE OF BIRTH:  [birth year]  DATE OF PROCEDURE:  07/17/2015 DATE OF DISCHARGE:                              OPERATIVE REPORT   PREOPERATIVE DIAGNOSIS: 1. A 41+2 weeks estimated gestation. 2. Fetal intolerance to labor.  POSTOPERATIVE DIAGNOSIS: 1. A 41+2 weeks estimated gestation. 2. Fetal intolerance to labor.  PROCEDURE:  Primary low transverse cesarean section.  ANESTHESIA:  Surgical dosing of continuous lumbar epidural.  SURGEON:  Jennell Corner, MD.  Kellie Mejia HeadsYetta Barre, nurse-midwife.  INDICATION:  A 24 year old gravida 1, para 0 patient at 41+2 weeks admitted for labor and induction.  The night before the procedure, the patient labored to 3 cm, and fetus demonstrated multiple intermittent bradycardic episodes, documented 8 of these within a 4-1/2-hour time frame.  Therefore, given the patient was only 3 cm with recurrent intermittent late decelerations, surgeon elected to proceed on with primary low transverse cesarean section.  DESCRIPTION OF PROCEDURE:  After adequate surgical dosing of continuous lumbar epidural, the patient was placed in a dorsal supine position with a hip roll on the right-side.  The patient's abdomen was prepped and draped in normal sterile fashion.  The patient did receive 2 g of IV Ancef prior to commencement of the case.  A Pfannenstiel incision was made 2 fingerbreadths above the symphysis pubis.  Sharp dissection was used to identify the fascia.  Fascia was opened in the midline and opened in a transverse fashion.  The superior aspect of the fascia was grasped with Kocher clamps, and the recti muscles were dissected free. The inferior aspect of the fascia was grasped with Kocher clamps. Pyramidalis muscle was dissected free.  Entry into the peritoneal  cavity was accomplished sharply.  The vesicular uterine peritoneal fold was identified, and a bladder flap was created, and the bladder was reflected inferiorly.  Full lower uterine segment was identified.  A low transverse uterine incision was made upon entry into the endometrial cavity.  Some mucoid fluid without malodor was encountered.  Fetal head was brought to the incision, and the head was delivered without difficulty followed by the shoulders and the body.  A vigorous female was passed to the nursery staff who assigned Apgar scores of 8 and 9, fetal weight 7 pounds 8 ounces.  The placenta was manually delivered, and the uterus was exteriorized.  Intravenous Pitocin was administered. Endometrial cavity was wiped clean with laparotomy tape.  The uterine incision was closed with a 1 chromic suture in a running, locking fashion.  Good approximation of edges, good hemostasis was noted. Fallopian tubes and ovaries appeared normal.  Posterior cul-de-sac was irrigated and suctioned.  The uterus was placed back into the abdominal cavity, and the pericolic gutters were wiped clean with laparotomy tape. The uterine incision again appeared hemostatic.  The superior aspect of the fascia was grasped with Kocher clamps, and the On-Q pump catheters were advanced from an infraumbilical position to a subfascial position. Two catheters were placed.  The fascia was then closed over top these catheters with 0 Vicryl in a running, nonlocking  fashion.  Good approximation of edges, good hemostasis was noted.  Subcutaneous tissues were irrigated and bovied for hemostasis, and the skin was reapproximated with Insorb absorbable staples.  Good cosmetic effect noted.  The On-Q pump catheters were secured at the skin level with LiquiBand and Steri-Strips, and a Tegaderm was placed over top.  Each catheter was loaded with 5 mL of 0.5% Marcaine.  There were no complications.  Estimated blood loss was 300 mL.   Intraoperative fluids were 1000 mL.  The patient tolerated the procedure well and was taken to recovery room in good condition.          ______________________________ Jennell Corner, MD     TS/MEDQ  D:  07/17/2015  T:  07/18/2015  Job:  [id number]

## 2015-07-20 NOTE — Op Note (Signed)
NAME:  Kellie Mejia, Kellie Mejia           ACCOUNT NO.:  [id number]  MEDICAL RECORD NO.:  [id number]  LOCATION:  344A                         FACILITY:  [facility]  PHYSICIAN:  Thomas Schermerhorn, MDDATE OF BIRTH:  11/17/1991  DATE OF PROCEDURE:  07/17/2015 DATE OF DISCHARGE:                              OPERATIVE REPORT   PREOPERATIVE DIAGNOSIS: 1. A 41+2 weeks estimated gestation. 2. Fetal intolerance to labor.  POSTOPERATIVE DIAGNOSIS: 1. A 41+2 weeks estimated gestation. 2. Fetal intolerance to labor.  PROCEDURE:  Primary low transverse cesarean section.  ANESTHESIA:  Surgical dosing of continuous lumbar epidural.  SURGEON:  Thomas Schermerhorn, MD.  ASSISTANT:  Jones, nurse-midwife.  INDICATION:  A 24-year-old gravida 1, para 0 patient at 41+2 weeks admitted for labor and induction.  The night before the procedure, the patient labored to 3 cm, and fetus demonstrated multiple intermittent bradycardic episodes, documented 8 of these within a 4-1/2-hour time frame.  Therefore, given the patient was only 3 cm with recurrent intermittent late decelerations, surgeon elected to proceed on with primary low transverse cesarean section.  DESCRIPTION OF PROCEDURE:  After adequate surgical dosing of continuous lumbar epidural, the patient was placed in a dorsal supine position with a hip roll on the right-side.  The patient's abdomen was prepped and draped in normal sterile fashion.  The patient did receive 2 g of IV Ancef prior to commencement of the case.  A Pfannenstiel incision was made 2 fingerbreadths above the symphysis pubis.  Sharp dissection was used to identify the fascia.  Fascia was opened in the midline and opened in a transverse fashion.  The superior aspect of the fascia was grasped with Kocher clamps, and the recti muscles were dissected free. The inferior aspect of the fascia was grasped with Kocher clamps. Pyramidalis muscle was dissected free.  Entry into the peritoneal  cavity was accomplished sharply.  The vesicular uterine peritoneal fold was identified, and a bladder flap was created, and the bladder was reflected inferiorly.  Full lower uterine segment was identified.  A low transverse uterine incision was made upon entry into the endometrial cavity.  Some mucoid fluid without malodor was encountered.  Fetal head was brought to the incision, and the head was delivered without difficulty followed by the shoulders and the body.  A vigorous female was passed to the nursery staff who assigned Apgar scores of 8 and 9, fetal weight 7 pounds 8 ounces.  The placenta was manually delivered, and the uterus was exteriorized.  Intravenous Pitocin was administered. Endometrial cavity was wiped clean with laparotomy tape.  The uterine incision was closed with a 1 chromic suture in a running, locking fashion.  Good approximation of edges, good hemostasis was noted. Fallopian tubes and ovaries appeared normal.  Posterior cul-de-sac was irrigated and suctioned.  The uterus was placed back into the abdominal cavity, and the pericolic gutters were wiped clean with laparotomy tape. The uterine incision again appeared hemostatic.  The superior aspect of the fascia was grasped with Kocher clamps, and the On-Q pump catheters were advanced from an infraumbilical position to a subfascial position. Two catheters were placed.  The fascia was then closed over top these catheters with 0 Vicryl in a running, nonlocking   fashion.  Good approximation of edges, good hemostasis was noted.  Subcutaneous tissues were irrigated and bovied for hemostasis, and the skin was reapproximated with Insorb absorbable staples.  Good cosmetic effect noted.  The On-Q pump catheters were secured at the skin level with LiquiBand and Steri-Strips, and a Tegaderm was placed over top.  Each catheter was loaded with 5 mL of 0.5% Marcaine.  There were no complications.  Estimated blood loss was 300 mL.   Intraoperative fluids were 1000 mL.  The patient tolerated the procedure well and was taken to recovery room in good condition.          ______________________________ Thomas Schermerhorn, MD     TS/MEDQ  D:  07/17/2015  T:  07/18/2015  Job:  [id number] 

## 2015-07-20 NOTE — Discharge Summary (Signed)
Obstetric Discharge Summary Reason for Admission: induction of labor Prenatal Procedures: none Intrapartum Procedures: cesarean: low cervical, transverse Postpartum Procedures: none Complications-Operative and Postpartum: none HEMOGLOBIN  Date Value Ref Range Status  07/18/2015 11.6* 12.0 - 16.0 g/dL Final   HCT  Date Value Ref Range Status  07/18/2015 35.1 35.0 - 47.0 % Final    Physical Exam:  General: alert and cooperative Lochia: appropriate Uterine Fundus: firm Incision: healing well DVT Evaluation: No evidence of DVT seen on physical exam. Lungs CTA  CV rrr  Discharge Diagnoses: Term Pregnancy-delivered  Discharge Information: Date: 07/20/2015 Activity: pelvic rest Diet: routine Medications: Ibuprofen, Colace and Percocet Condition: stable Instructions: refer to practice specific booklet Discharge to: home Follow-up Information    Follow up with Isiaha Greenup, MD In 2 weeks.   Specialty:  Obstetrics and Gynecology   Why:  For wound re-check   Contact information:   [address] [place] [address] [phone]       Newborn Data: Live born female  Birth Weight: 7 lb 7.6 oz (3391 g) APGAR: 8, 9  Home with mother.  Kellie Mejia 07/20/2015, 9:13 AM

## 2015-07-20 NOTE — Progress Notes (Signed)
All discharge instructions given to patient and she voiced understanding of all instructions given.  appt made for f/u appointment for incision check  Prescription given.

## 2015-07-20 NOTE — Discharge Instructions (Signed)
Follow up sooner with fever greater than 100.5, problems breathing, pain not helped by medications, severe depression ( more than just baby blues, wanting to hurt yourself or the baby), severe bleeding( saturating more than one pad an hour or large palm sized clots),or any incisional concerns such as increased redness, swelling, discharge or increased pain in incision.  No heavy lifting for 6 weeks.  No driving while taking narcotics. No douches, intercourse, tampons, or enemas for 6 weeks.  °

## 2015-07-22 LAB — SURGICAL PATHOLOGY

## 2015-07-29 ENCOUNTER — Encounter: Payer: Self-pay | Admitting: Obstetrics and Gynecology

## 2015-09-16 ENCOUNTER — Encounter: Payer: Self-pay | Admitting: Emergency Medicine

## 2015-09-16 ENCOUNTER — Emergency Department
Admission: EM | Admit: 2015-09-16 | Discharge: 2015-09-16 | Disposition: A | Discharge status: Home / Self Care | Payer: Medicaid Other | Attending: Emergency Medicine | Admitting: Emergency Medicine

## 2015-09-16 DIAGNOSIS — Z79899 Other long term (current) drug therapy: Secondary | ICD-10-CM | POA: Diagnosis not present

## 2015-09-16 DIAGNOSIS — Y998 Other external cause status: Secondary | ICD-10-CM | POA: Insufficient documentation

## 2015-09-16 DIAGNOSIS — Y9289 Other specified places as the place of occurrence of the external cause: Secondary | ICD-10-CM | POA: Insufficient documentation

## 2015-09-16 DIAGNOSIS — S8391XA Sprain of unspecified site of right knee, initial encounter: Secondary | ICD-10-CM | POA: Insufficient documentation

## 2015-09-16 DIAGNOSIS — Y9364 Activity, baseball: Secondary | ICD-10-CM | POA: Diagnosis not present

## 2015-09-16 DIAGNOSIS — S8991XA Unspecified injury of right lower leg, initial encounter: Secondary | ICD-10-CM | POA: Diagnosis present

## 2015-09-16 DIAGNOSIS — X58XXXA Exposure to other specified factors, initial encounter: Secondary | ICD-10-CM | POA: Insufficient documentation

## 2015-09-16 MED ORDER — MELOXICAM 15 MG PO TABS: 15.0000 mg | ORAL_TABLET | Freq: Every day | ORAL | Status: DC

## 2015-09-16 NOTE — ED Provider Notes (Signed)
[facility] Emergency Department Provider Note  ____________________________________________  Time seen: Approximately 3:19 PM  I have reviewed the triage vital signs and the nursing notes.   HISTORY  Chief Complaint Knee Injury    HPI Kellie Mejia is a 24 y.o. female who presents to the emergency department complaining of right knee pain. She states that she used to play a significant amount of sports and had some "knee problems" in the past, however she has recently played softball and is now having pain to her right knee. York SpanielSaid initially she thought it was just "soreness" from returning to sports however this pain has not resolved in the intervening period. She states that the pain is localized to her right knee on the anterior and medial surfaces. She denies any significant injury to same. She denies any numbness or tingling. She denies any swelling.   Past Medical History  Diagnosis Date  . Anxiety   . Depression   . Asthma     excercise induced  . GERD (gastroesophageal reflux disease)     Patient Active Problem List   Diagnosis Date Noted  . Postoperative state 07/17/2015  . Labor and delivery, indication for care 07/16/2015  . Pregnancy 07/10/2015  . Abdominal pain 07/01/2015  . Abdominal pain affecting pregnancy 06/14/2015  . Indication for care in labor and delivery, antepartum 05/27/2015    Past Surgical History  Procedure Laterality Date  . Wisdom tooth extraction    . Cesarean section N/A 07/17/2015    Procedure: CESAREAN SECTION;  Surgeon: Suzy Bouchardhomas J Schermerhorn, MD;  Location: [facility] ORS;  Service: Obstetrics;  Laterality: N/A;    Current Outpatient Rx  Name  Route  Sig  Dispense  Refill  . docusate sodium (COLACE) 100 MG capsule   Oral   Take 1 capsule (100 mg total) by mouth 2 (two) times daily.   30 capsule   0   . ibuprofen (ADVIL,MOTRIN) 600 MG tablet   Oral   Take 1 tablet (600 mg total) by mouth every 6 (six)  hours.   60 tablet   0   . meloxicam (MOBIC) 15 MG tablet   Oral   Take 1 tablet (15 mg total) by mouth daily.   30 tablet   0   . oxyCODONE-acetaminophen (PERCOCET/ROXICET) 5-325 MG per tablet   Oral   Take 1-2 tablets by mouth every 6 (six) hours as needed (for pain scale greater than 7).   30 tablet   0   . Prenatal Vit-Fe Fumarate-FA (PRENATAL MULTIVITAMIN) TABS tablet   Oral   Take 1 tablet by mouth daily at 12 noon.         . ranitidine (ZANTAC) 150 MG tablet   Oral   Take 150 mg by mouth 2 (two) times daily.           Allergies Review of patient's allergies indicates no known allergies.  Family History  Problem Relation Age of Onset  . Hypertension Maternal Grandfather     Social History Social History  Substance Use Topics  . Smoking status: Never Smoker   . Smokeless tobacco: Never Used  . Alcohol Use: No    Review of Systems Constitutional: No fever/chills Eyes: No visual changes. ENT: No sore throat. Cardiovascular: Denies chest pain. Respiratory: Denies shortness of breath. Gastrointestinal: No abdominal pain.  No nausea, no vomiting.  No diarrhea.  No constipation. Genitourinary: Negative for dysuria. Musculoskeletal: Negative for back pain. Endorses right knee pain. Skin: Negative for  rash. Neurological: Negative for headaches, focal weakness or numbness.  10-point ROS otherwise negative.  ____________________________________________   PHYSICAL EXAM:  VITAL SIGNS: ED Triage Vitals  Enc Vitals Group     BP 09/16/15 1448 127/63 mmHg     Pulse Rate 09/16/15 1448 69     Resp 09/16/15 1448 20     Temp 09/16/15 1448 98.4 F (36.9 C)     Temp Source 09/16/15 1448 Oral     SpO2 09/16/15 1448 97 %     Weight 09/16/15 1448 180 lb (81.647 kg)     Height 09/16/15 1448 5' (1.524 m)     Head Cir --      Peak Flow --      Pain Score 09/16/15 1449 4     Pain Loc --      Pain Edu? --      Excl. in GC? --     Constitutional: Alert and  oriented. Well appearing and in no acute distress. Eyes: Conjunctivae are normal. PERRL. EOMI. Head: Atraumatic. Nose: No congestion/rhinnorhea. Mouth/Throat: Mucous membranes are moist.  Oropharynx non-erythematous. Neck: No stridor.   Cardiovascular: Normal rate, regular rhythm. Grossly normal heart sounds.  Good peripheral circulation. Respiratory: Normal respiratory effort.  No retractions. Lungs CTAB. Gastrointestinal: Soft and nontender. No distention. No abdominal bruits. No CVA tenderness. Musculoskeletal: No lower extremity tenderness nor edema.  No joint effusions. No visible abnormality to the right knee compared with the left. No edema or erythema visualized. Full range of motion to affected knee. Mild tenderness to palpation over the MCL. Lachman's is negative. There is some valgus is negative when compared with the unaffected knee. Neurologic:  Normal speech and language. No gross focal neurologic deficits are appreciated. No gait instability. Skin:  Skin is warm, dry and intact. No rash noted. Psychiatric: Mood and affect are normal. Speech and behavior are normal.  ____________________________________________   LABS (all labs ordered are listed, but only abnormal results are displayed)  Labs Reviewed - No data to display ____________________________________________  EKG   ____________________________________________  RADIOLOGY   ____________________________________________   PROCEDURES  Procedure(s) performed: None  Critical Care performed: No  ____________________________________________   INITIAL IMPRESSION / ASSESSMENT AND PLAN / ED COURSE  Pertinent labs & imaging results that were available during my care of the patient were reviewed by me and considered in my medical decision making (see chart for details).  The patient's history, symptoms, physical exam are consistent with knee sprain with residual inflammation to right knee. I advised the patient  of findings and diagnosis. The patient is to be placed on anti-inflammatories for same. I advised the patient to use for 2-3 weeks before seeing orthopedics should symptoms persist. Patient verbalizes understanding of same. Advised the patient to use a soft neoprene brace for additional symptom relief. Patient verbalizes understanding of diagnosis and treatment plan and verbalizes compliance with same. ____________________________________________   FINAL CLINICAL IMPRESSION(S) / ED DIAGNOSES  Final diagnoses:  Knee sprain, right, initial encounter      Racheal Patches, PA-C 09/16/15 1547  Myrna Blazer, MD 09/16/15 2211

## 2015-09-16 NOTE — ED Notes (Signed)
Patient was playing softball on two weeks ago, played for several days in a row and is now having right knee pain and is limping. Patient states that she has had problems with her knees in the past, was told she possibly had arthritis.

## 2015-09-16 NOTE — Discharge Instructions (Signed)
Knee Sprain A knee sprain is a tear in one of the strong, fibrous tissues that connect the bones (ligaments) in your knee. The severity of the sprain depends on how much of the ligament is torn. The tear can be either partial or complete. CAUSES  Often, sprains are a result of a fall or injury. The force of the impact causes the fibers of your ligament to stretch too much. This excess tension causes the fibers of your ligament to tear. SIGNS AND SYMPTOMS  You may have some loss of motion in your knee. Other symptoms include:  Bruising.  Pain in the knee area.  Tenderness of the knee to the touch.  Swelling. DIAGNOSIS  To diagnose a knee sprain, your health care provider will physically examine your knee. Your health care provider may also suggest an X-ray exam of your knee to make sure no bones are broken. TREATMENT  If your ligament is only partially torn, treatment usually involves keeping the knee in a fixed position (immobilization) or bracing your knee for activities that require movement for several weeks. To do this, your health care provider will apply a bandage, cast, or splint to keep your knee from moving and to support your knee during movement until it heals. For a partially torn ligament, the healing process usually takes 4-6 weeks. If your ligament is completely torn, depending on which ligament it is, you may need surgery to reconnect the ligament to the bone or reconstruct it. After surgery, a cast or splint may be applied and will need to stay on your knee for 4-6 weeks while your ligament heals. HOME CARE INSTRUCTIONS  Keep your injured knee elevated to decrease swelling.  To ease pain and swelling, apply ice to the injured area:  Put ice in a plastic bag.  Place a towel between your skin and the bag.  Leave the ice on for 20 minutes, 2-3 times a day.  Only take medicine for pain as directed by your health care provider.  Do not leave your knee unprotected until  pain and stiffness go away (usually 4-6 weeks).  If you have a cast or splint, do not allow it to get wet. If you have been instructed not to remove it, cover it with a plastic bag when you shower or bathe. Do not swim.  Your health care provider may suggest exercises for you to do during your recovery to prevent or limit permanent weakness and stiffness. SEEK IMMEDIATE MEDICAL CARE IF:  Your cast or splint becomes damaged.  Your pain becomes worse.  You have significant pain, swelling, or numbness below the cast or splint. MAKE SURE YOU:  Understand these instructions.  Will watch your condition.  Will get help right away if you are not doing well or get worse.   This information is not intended to replace advice given to you by your health care provider. Make sure you discuss any questions you have with your health care provider.   Document Released: 11/07/2005 Document Revised: 11/28/2014 Document Reviewed: 06/19/2013 Elsevier Interactive Patient Education 2016 [employer]  How to Use a Knee Brace A knee brace is a device that you wear to support your knee, especially if the knee is healing after an injury or surgery. There are several types of knee braces. Some are designed to prevent an injury (prophylactic brace). These are often worn during sports. Others support an injured knee (functional brace) or keep it still while it heals (rehabilitative brace). People with severe  arthritis of the knee may benefit from a brace that takes some pressure off the knee (unloader brace). Most knee braces are made from a combination of cloth and metal or plastic.  You may need to wear a knee brace to:  Relieve knee pain.  Help your knee support your weight (improve stability).  Help you walk farther (improve mobility).  Prevent injury.  Support your knee while it heals from surgery or from an injury. RISKS AND COMPLICATIONS Generally, knee braces are very safe to wear. However,  problems may occur, including:  Skin irritation that may lead to infection.  Making your condition worse if you wear the brace in the wrong way. HOW TO USE A KNEE BRACE Different braces will have different instructions for use. Your health care provider will tell you or show you:  How to put on your brace.  How to adjust the brace.  When and how often to wear the brace.  How to remove the brace.  If you will need any assistive devices in addition to the brace, such as crutches or a cane. In general, your brace should:  Have the hinge of the brace line up with the bend of your knee.  Have straps, hooks, or tapes that fasten snugly around your leg.  Not feel too tight or too loose. HOW TO CARE FOR A KNEE BRACE  Check your brace often for signs of damage, such as loose connections or attachments. Your knee brace may get damaged or wear out during normal use.  Wash the fabric parts of your brace with soap and water.  Read the insert that comes with your brace for other specific care instructions. SEEK MEDICAL CARE IF:  Your knee brace is too loose or too tight and you cannot adjust it.  Your knee brace causes skin redness, swelling, bruising, or irritation.  Your knee brace is not helping.  Your knee brace is making your knee pain worse.   This information is not intended to replace advice given to you by your health care provider. Make sure you discuss any questions you have with your health care provider.   Document Released: 01/28/2004 Document Revised: 07/29/2015 Document Reviewed: 03/02/2015 Elsevier Interactive Patient Education [employer]

## 2015-09-16 NOTE — ED Notes (Signed)
Pt reports recently playing softball, unsure of definite injury; reports continued right knee pain. Pt is ambulatory to triage.

## 2016-02-17 ENCOUNTER — Emergency Department: Payer: Self-pay

## 2016-02-17 ENCOUNTER — Emergency Department
Admission: EM | Admit: 2016-02-17 | Discharge: 2016-02-17 | Disposition: A | Discharge status: Home / Self Care | Payer: Self-pay | Attending: Emergency Medicine | Admitting: Emergency Medicine

## 2016-02-17 ENCOUNTER — Encounter: Payer: Self-pay | Admitting: Emergency Medicine

## 2016-02-17 DIAGNOSIS — J069 Acute upper respiratory infection, unspecified: Secondary | ICD-10-CM | POA: Insufficient documentation

## 2016-02-17 DIAGNOSIS — J45909 Unspecified asthma, uncomplicated: Secondary | ICD-10-CM | POA: Insufficient documentation

## 2016-02-17 DIAGNOSIS — Z79899 Other long term (current) drug therapy: Secondary | ICD-10-CM | POA: Insufficient documentation

## 2016-02-17 DIAGNOSIS — F329 Major depressive disorder, single episode, unspecified: Secondary | ICD-10-CM | POA: Insufficient documentation

## 2016-02-17 MED ORDER — GUAIFENESIN-CODEINE 100-10 MG/5ML PO SOLN: 10.0000 mL | ORAL | Status: DC | PRN

## 2016-02-17 MED ORDER — AZITHROMYCIN 250 MG PO TABS: ORAL_TABLET | ORAL | Status: DC

## 2016-02-17 MED ORDER — IPRATROPIUM-ALBUTEROL 0.5-2.5 (3) MG/3ML IN SOLN
3.0000 mL | Freq: Once | RESPIRATORY_TRACT | Status: AC
  Administered 2016-02-17: 3 mL via RESPIRATORY_TRACT
  Filled 2016-02-17: qty 3

## 2016-02-17 MED ORDER — ALBUTEROL SULFATE HFA 108 (90 BASE) MCG/ACT IN AERS: 2.0000 | INHALATION_SPRAY | Freq: Four times a day (QID) | RESPIRATORY_TRACT | Status: DC | PRN

## 2016-02-17 NOTE — ED Notes (Signed)
Pt reports cough, congestion and wheezing x3 days; pt with hx of asthma. Pt ambulatory to triage with no distress noted.

## 2016-02-17 NOTE — Discharge Instructions (Signed)
Upper Respiratory Infection, Adult Most upper respiratory infections (URIs) are a viral infection of the air passages leading to the lungs. A URI affects the nose, throat, and upper air passages. The most common type of URI is nasopharyngitis and is typically referred to as "the common cold." URIs run their course and usually go away on their own. Most of the time, a URI does not require medical attention, but sometimes a bacterial infection in the upper airways can follow a viral infection. This is called a secondary infection. Sinus and middle ear infections are common types of secondary upper respiratory infections. Bacterial pneumonia can also complicate a URI. A URI can worsen asthma and chronic obstructive pulmonary disease (COPD). Sometimes, these complications can require emergency medical care and may be life threatening.  CAUSES Almost all URIs are caused by viruses. A virus is a type of germ and can spread from one person to another.  RISKS FACTORS You may be at risk for a URI if:   You smoke.   You have chronic heart or lung disease.  You have a weakened defense (immune) system.   You are very young or very old.   You have nasal allergies or asthma.  You work in crowded or poorly ventilated areas.  You work in health care facilities or schools. SIGNS AND SYMPTOMS  Symptoms typically develop 2-3 days after you come in contact with a cold virus. Most viral URIs last 7-10 days. However, viral URIs from the influenza virus (flu virus) can last 14-18 days and are typically more severe. Symptoms may include:   Runny or stuffy (congested) nose.   Sneezing.   Cough.   Sore throat.   Headache.   Fatigue.   Fever.   Loss of appetite.   Pain in your forehead, behind your eyes, and over your cheekbones (sinus pain).  Muscle aches.  DIAGNOSIS  Your health care provider may diagnose a URI by:  Physical exam.  Tests to check that your symptoms are not due to  another condition such as:  Strep throat.  Sinusitis.  Pneumonia.  Asthma. TREATMENT  A URI goes away on its own with time. It cannot be cured with medicines, but medicines may be prescribed or recommended to relieve symptoms. Medicines may help:  Reduce your fever.  Reduce your cough.  Relieve nasal congestion. HOME CARE INSTRUCTIONS   Take medicines only as directed by your health care provider.   Gargle warm saltwater or take cough drops to comfort your throat as directed by your health care provider.  Use a warm mist humidifier or inhale steam from a shower to increase air moisture. This may make it easier to breathe.  Drink enough fluid to keep your urine clear or pale yellow.   Eat soups and other clear broths and maintain good nutrition.   Rest as needed.   Return to work when your temperature has returned to normal or as your health care provider advises. You may need to stay home longer to avoid infecting others. You can also use a face mask and careful hand washing to prevent spread of the virus.  Increase the usage of your inhaler if you have asthma.   Do not use any tobacco products, including cigarettes, chewing tobacco, or electronic cigarettes. If you need help quitting, ask your health care provider. PREVENTION  The best way to protect yourself from getting a cold is to practice good hygiene.   Avoid oral or hand contact with people with cold   symptoms.   Wash your hands often if contact occurs.  There is no clear evidence that vitamin C, vitamin E, echinacea, or exercise reduces the chance of developing a cold. However, it is always recommended to get plenty of rest, exercise, and practice good nutrition.  SEEK MEDICAL CARE IF:   You are getting worse rather than better.   Your symptoms are not controlled by medicine.   You have chills.  You have worsening shortness of breath.  You have brown or red mucus.  You have yellow or brown nasal  discharge.  You have pain in your face, especially when you bend forward.  You have a fever.  You have swollen neck glands.  You have pain while swallowing.  You have white areas in the back of your throat. SEEK IMMEDIATE MEDICAL CARE IF:   You have severe or persistent:  Headache.  Ear pain.  Sinus pain.  Chest pain.  You have chronic lung disease and any of the following:  Wheezing.  Prolonged cough.  Coughing up blood.  A change in your usual mucus.  You have a stiff neck.  You have changes in your:  Vision.  Hearing.  Thinking.  Mood. MAKE SURE YOU:   Understand these instructions.  Will watch your condition.  Will get help right away if you are not doing well or get worse.   This information is not intended to replace advice given to you by your health care provider. Make sure you discuss any questions you have with your health care provider.   Document Released: 05/03/2001 Document Revised: 03/24/2015 Document Reviewed: 02/12/2014 Elsevier Interactive Patient Education 2016 Elsevier Inc.  

## 2016-02-17 NOTE — ED Provider Notes (Signed)
[facility] Emergency Department Provider Note  ____________________________________________  Time seen: Approximately 1:38 PM  I have reviewed the triage vital signs and the nursing notes.   HISTORY  Chief Complaint URI    HPI Kellie Mejia is a 25 y.o. female presents for evaluation of cough congestion and wheezing for 3 days. Past medical history significant for asthma but does not have an inhaler at home. Patient states that she's been exposed to bronchitis strep throat in the stomach virus. 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   Past Medical History  Diagnosis Date  . Anxiety   . Depression   . Asthma     excercise induced  . GERD (gastroesophageal reflux disease)     Patient Active Problem List   Diagnosis Date Noted  . Postoperative state 07/17/2015  . Labor and delivery, indication for care 07/16/2015  . Pregnancy 07/10/2015  . Abdominal pain 07/01/2015  . Abdominal pain affecting pregnancy 06/14/2015  . Indication for care in labor and delivery, antepartum 05/27/2015    Past Surgical History  Procedure Laterality Date  . Wisdom tooth extraction    . Cesarean section N/A 07/17/2015    Procedure: CESAREAN SECTION;  Surgeon: Suzy Bouchardhomas J Schermerhorn, MD;  Location: [facility] ORS;  Service: Obstetrics;  Laterality: N/A;    Current Outpatient Rx  Name  Route  Sig  Dispense  Refill  . albuterol (PROVENTIL HFA;VENTOLIN HFA) 108 (90 Base) MCG/ACT inhaler   Inhalation   Inhale 2 puffs into the lungs every 6 (six) hours as needed for wheezing or shortness of breath.   1 Inhaler   2   . azithromycin (ZITHROMAX Z-PAK) 250 MG tablet      Take 2 tablets (500 mg) on  Day 1,  followed by 1 tablet (250 mg) once daily on Days 2 through 5.   6 each   0   . docusate sodium (COLACE) 100 MG capsule   Oral   Take 1 capsule (100 mg total) by mouth 2 (two) times daily.   30 capsule   0   . guaiFENesin-codeine 100-10 MG/5ML syrup    Oral   Take 10 mLs by mouth every 4 (four) hours as needed for cough.   180 mL   0   . Prenatal Vit-Fe Fumarate-FA (PRENATAL MULTIVITAMIN) TABS tablet   Oral   Take 1 tablet by mouth daily at 12 noon.         . ranitidine (ZANTAC) 150 MG tablet   Oral   Take 150 mg by mouth 2 (two) times daily.           Allergies Review of patient's allergies indicates no known allergies.  Family History  Problem Relation Age of Onset  . Hypertension Maternal Grandfather     Social History Social History  Substance Use Topics  . Smoking status: Never Smoker   . Smokeless tobacco: Never Used  . Alcohol Use: No    Review of Systems Constitutional: No fever/chills Eyes: No visual changes. ENT: Minimal sore throat. Positive for drainage Cardiovascular: Denies chest pain. Respiratory: Denies shortness of breath. Positive for cough nonproductive Gastrointestinal: No abdominal pain.  No nausea, no vomiting.  No diarrhea.  No constipation. Musculoskeletal: Negative for back pain. Skin: Negative for rash. Neurological: Negative for headaches, focal weakness or numbness.  10-point ROS otherwise negative.  ____________________________________________   PHYSICAL EXAM:  VITAL SIGNS: ED Triage Vitals  Enc Vitals Group     BP 02/17/16 1244 126/78  mmHg     Pulse Rate 02/17/16 1244 79     Resp 02/17/16 1244 20     Temp 02/17/16 1244 98.7 F (37.1 C)     Temp Source 02/17/16 1244 Oral     SpO2 02/17/16 1244 95 %     Weight 02/17/16 1244 185 lb (83.915 kg)     Height 02/17/16 1244  (1.626 m)     Head Cir --      Peak Flow --      Pain Score 02/17/16 1248 3     Pain Loc --      Pain Edu? --      Excl. in GC? --     Constitutional: Alert and oriented. Well appearing and in no acute distress. Head: Atraumatic. Nose: Positive congestion/rhinnorhea. Mouth/Throat: Mucous membranes are moist.  Oropharynx non-erythematous. Neck: No stridor. No cervical adenopathy.    Cardiovascular: Normal rate, regular rhythm. Grossly normal heart sounds.  Good peripheral circulation. Respiratory: Normal respiratory effort.  No retractions. Lungs wheezing scattered bilaterally with decreased breath sounds. Musculoskeletal: No lower extremity tenderness nor edema.  No joint effusions. Neurologic:  Normal speech and language. No gross focal neurologic deficits are appreciated. No gait instability. Skin:  Skin is warm, dry and intact. No rash noted. Psychiatric: Mood and affect are normal. Speech and behavior are normal.  ____________________________________________   LABS (all labs ordered are listed, but only abnormal results are displayed)  Labs Reviewed - No data to display ____________________________________________   RADIOLOGY  No acute cardiopulmonary findings. ____________________________________________   PROCEDURES  Procedure(s) performed: None  Critical Care performed: No  ____________________________________________   INITIAL IMPRESSION / ASSESSMENT AND PLAN / ED COURSE  Pertinent labs & imaging results that were available during my care of the patient were reviewed by me and considered in my medical decision making (see chart for details).  Acute URI. Rx given for Zithromax, Robitussin-AC and Proventil inhaler. Patient to follow up with PCP or return here with any worsening symptomology. She had a DuoNeb treatment while in the ED with minimal change. Patient voices no other emergency medical complaints at this time and is ready for discharge. ____________________________________________   FINAL CLINICAL IMPRESSION(S) / ED DIAGNOSES  Final diagnoses:  URI, acute     This chart was dictated using voice recognition software/Dragon. Despite best efforts to proofread, errors can occur which can change the meaning. Any change was purely unintentional.   Evangeline Dakin, PA-C 02/17/16 1504  Emily Filbert, MD 02/17/16 [phone]

## 2016-03-01 ENCOUNTER — Encounter: Payer: Self-pay | Admitting: Medical Oncology

## 2016-03-01 ENCOUNTER — Emergency Department: Payer: Self-pay

## 2016-03-01 ENCOUNTER — Emergency Department
Admission: EM | Admit: 2016-03-01 | Discharge: 2016-03-01 | Disposition: A | Discharge status: Home / Self Care | Payer: Self-pay | Attending: Emergency Medicine | Admitting: Emergency Medicine

## 2016-03-01 DIAGNOSIS — F418 Other specified anxiety disorders: Secondary | ICD-10-CM | POA: Insufficient documentation

## 2016-03-01 DIAGNOSIS — J4520 Mild intermittent asthma, uncomplicated: Secondary | ICD-10-CM

## 2016-03-01 DIAGNOSIS — J4521 Mild intermittent asthma with (acute) exacerbation: Secondary | ICD-10-CM | POA: Insufficient documentation

## 2016-03-01 DIAGNOSIS — K219 Gastro-esophageal reflux disease without esophagitis: Secondary | ICD-10-CM | POA: Insufficient documentation

## 2016-03-01 DIAGNOSIS — Z791 Long term (current) use of non-steroidal anti-inflammatories (NSAID): Secondary | ICD-10-CM | POA: Insufficient documentation

## 2016-03-01 MED ORDER — BENZONATATE 100 MG PO CAPS: 100.0000 mg | ORAL_CAPSULE | Freq: Three times a day (TID) | ORAL | Status: DC | PRN

## 2016-03-01 MED ORDER — METHYLPREDNISOLONE SODIUM SUCC 125 MG IJ SOLR
125.0000 mg | Freq: Once | INTRAMUSCULAR | Status: AC
  Administered 2016-03-01: 125 mg via INTRAMUSCULAR
  Filled 2016-03-01: qty 2

## 2016-03-01 MED ORDER — IPRATROPIUM-ALBUTEROL 0.5-2.5 (3) MG/3ML IN SOLN
3.0000 mL | Freq: Once | RESPIRATORY_TRACT | Status: AC
  Administered 2016-03-01: 3 mL via RESPIRATORY_TRACT
  Filled 2016-03-01: qty 3

## 2016-03-01 MED ORDER — PREDNISONE 10 MG PO TABS: 50.0000 mg | ORAL_TABLET | Freq: Every day | ORAL | Status: DC

## 2016-03-01 MED ORDER — IPRATROPIUM-ALBUTEROL 18-103 MCG/ACT IN AERO: 1.0000 | INHALATION_SPRAY | Freq: Four times a day (QID) | RESPIRATORY_TRACT | Status: DC | PRN

## 2016-03-01 NOTE — Discharge Instructions (Signed)

## 2016-03-01 NOTE — ED Provider Notes (Signed)
[facility] Emergency Department Provider Note  ____________________________________________  Time seen: Approximately 2:09 PM  I have reviewed the triage vital signs and the nursing notes.   HISTORY  Chief Complaint URI    HPI Kellie Mejia is a 25 y.o. female who was seen by myself returns with complaints of wheezing, thinks that she may be overusing her inhaler,. Patient states that she is up all night coughing and feels short of breath. Patient reports not getting better since finished her antibiotics. Patient reports that she is coughing and wheezing for the past 3 days up all night with her chest feeling tight. She reports being out of her cough medication and taking the antibiotics as prescribed from previous visit.   Past Medical History  Diagnosis Date  . Anxiety   . Depression   . Asthma     excercise induced  . GERD (gastroesophageal reflux disease)     Patient Active Problem List   Diagnosis Date Noted  . Postoperative state 07/17/2015  . Labor and delivery, indication for care 07/16/2015  . Pregnancy 07/10/2015  . Abdominal pain 07/01/2015  . Abdominal pain affecting pregnancy 06/14/2015  . Indication for care in labor and delivery, antepartum 05/27/2015    Past Surgical History  Procedure Laterality Date  . Wisdom tooth extraction    . Cesarean section N/A 07/17/2015    Procedure: CESAREAN SECTION;  Surgeon: Suzy Bouchard, MD;  Location: [facility] ORS;  Service: Obstetrics;  Laterality: N/A;    Current Outpatient Rx  Name  Route  Sig  Dispense  Refill  . albuterol (PROVENTIL HFA;VENTOLIN HFA) 108 (90 Base) MCG/ACT inhaler   Inhalation   Inhale 2 puffs into the lungs every 6 (six) hours as needed for wheezing or shortness of breath.   1 Inhaler   2   . albuterol-ipratropium (COMBIVENT) 18-103 MCG/ACT inhaler   Inhalation   Inhale 1 puff into the lungs every 6 (six) hours as needed for wheezing or shortness of  breath.   1 Inhaler   2   . benzonatate (TESSALON PERLES) 100 MG capsule   Oral   Take 1 capsule (100 mg total) by mouth 3 (three) times daily as needed for cough.   21 capsule   0   . predniSONE (DELTASONE) 10 MG tablet   Oral   Take 5 tablets (50 mg total) by mouth daily with breakfast.   25 tablet   0     Allergies Review of patient's allergies indicates no known allergies.  Family History  Problem Relation Age of Onset  . Hypertension Maternal Grandfather     Social History Social History  Substance Use Topics  . Smoking status: Never Smoker   . Smokeless tobacco: Never Used  . Alcohol Use: No    Review of Systems Constitutional: No fever/chills Eyes: No visual changes. ENT: No sore throat. Cardiovascular: Denies chest pain. Respiratory: Positive for shortness of breath and coughing. Musculoskeletal: Negative for back pain. Skin: Negative for rash. Neurological: Negative for headaches, focal weakness or numbness.  10-point ROS otherwise negative.  ____________________________________________   PHYSICAL EXAM:  VITAL SIGNS: ED Triage Vitals  Enc Vitals Group     BP 03/01/16 1400 130/95 mmHg     Pulse Rate 03/01/16 1400 90     Resp 03/01/16 1400 17     Temp 03/01/16 1400 98.4 F (36.9 C)     Temp Source 03/01/16 1400 Oral     SpO2 03/01/16 1400 97 %  Weight 03/01/16 1400 185 lb (83.915 kg)     Height --      Head Cir --      Peak Flow --      Pain Score 03/01/16 1402 7     Pain Loc --      Pain Edu? --      Excl. in GC? --     Constitutional: Alert and oriented. Well appearing and in no acute distress. Eyes: Conjunctivae are normal. PERRL. EOMI. Head: Atraumatic. Nose: No congestion/rhinnorhea. Mouth/Throat: Mucous membranes are moist.  Oropharynx non-erythematous. Neck: No stridor.   Cardiovascular: Normal rate, regular rhythm. Grossly normal heart sounds.  Good peripheral circulation. Respiratory: Normal respiratory effort.  No  retractions. Lungs CTAB. Gastrointestinal: Soft and nontender. No distention. No abdominal bruits. No CVA tenderness. Musculoskeletal: No lower extremity tenderness nor edema.  No joint effusions. Neurologic:  Normal speech and language. No gross focal neurologic deficits are appreciated. No gait instability. Skin:  Skin is warm, dry and intact. No rash noted. Psychiatric: Mood and affect are normal. Speech and behavior are normal.  ____________________________________________   LABS (all labs ordered are listed, but only abnormal results are displayed)  Labs Reviewed - No data to display ____________________________________________  EKG   ____________________________________________  RADIOLOGY  No acute cardiopulmonary findings noted. ____________________________________________   PROCEDURES  Procedure(s) performed: None  Critical Care performed: No  ____________________________________________   INITIAL IMPRESSION / ASSESSMENT AND PLAN / ED COURSE  Pertinent labs & imaging results that were available during my care of the patient were reviewed by me and considered in my medical decision making (see chart for details).  DuoNeb treatment & Medrol 125 IM ordered. Initial impression is acute exacerbation of asthma. Patient sent for chest x-ray. Reassessed patient after return from radiology patient still has some minimal wheezing noted. Discussed clinical 1 in some diagnosis of acute reactive airway disorder may or may not be seasonally related. Talked to the patient's mother over the phone about course of treatment and plan. Reassured the patient about the natural progression of where this may go she is to follow-up with her PCP or return to the ER with any worsening symptomology. Encourage patient to follow-up with medication management 8 and the prescription. ____________________________________________   FINAL CLINICAL IMPRESSION(S) / ED DIAGNOSES  Final diagnoses:   RAD (reactive airway disease) with wheezing, mild intermittent, uncomplicated     This chart was dictated using voice recognition software/Dragon. Despite best efforts to proofread, errors can occur which can change the meaning. Any change was purely unintentional.   Evangeline Dakinharles M Beers, PA-C 03/01/16 1629  Jene Everyobert Kinner, MD 03/04/16 [phone]

## 2016-03-01 NOTE — ED Notes (Signed)
Pt in via triage; reports she was seen 2 weeks ago and treated w/ antibiotics, inhaler for URI.  Pt reports not getting any better since finishing antibiotics; pt states not getting sleep due to coughing and shortness of breath.  Pt ambulatory to room, speaking in full sentences, no immediate distress at this time.

## 2016-03-01 NOTE — ED Notes (Signed)
Pt reports being diagnosed with URI 2 weeks ago and sx's have not improved.

## 2016-06-03 ENCOUNTER — Emergency Department
Admission: EM | Admit: 2016-06-03 | Discharge: 2016-06-03 | Disposition: A | Discharge status: Home / Self Care | Payer: Medicaid Other | Attending: Emergency Medicine | Admitting: Emergency Medicine

## 2016-06-03 ENCOUNTER — Encounter: Payer: Self-pay | Admitting: Emergency Medicine

## 2016-06-03 DIAGNOSIS — J45909 Unspecified asthma, uncomplicated: Secondary | ICD-10-CM | POA: Insufficient documentation

## 2016-06-03 DIAGNOSIS — Z79899 Other long term (current) drug therapy: Secondary | ICD-10-CM | POA: Insufficient documentation

## 2016-06-03 DIAGNOSIS — F329 Major depressive disorder, single episode, unspecified: Secondary | ICD-10-CM | POA: Insufficient documentation

## 2016-06-03 DIAGNOSIS — Z7952 Long term (current) use of systemic steroids: Secondary | ICD-10-CM | POA: Insufficient documentation

## 2016-06-03 DIAGNOSIS — L0100 Impetigo, unspecified: Secondary | ICD-10-CM | POA: Insufficient documentation

## 2016-06-03 MED ORDER — TRIAMCINOLONE ACETONIDE 0.1 % EX CREA: 1.0000 "application " | TOPICAL_CREAM | Freq: Four times a day (QID) | CUTANEOUS | Status: DC

## 2016-06-03 MED ORDER — CEPHALEXIN 500 MG PO CAPS: 500.0000 mg | ORAL_CAPSULE | Freq: Four times a day (QID) | ORAL | Status: DC

## 2016-06-03 NOTE — ED Notes (Signed)
Patient states that her daughter recently diagnosed with impetigo, patient has now developed rash on bilateral legs

## 2016-06-03 NOTE — Discharge Instructions (Signed)
Impetigo, Adult  Impetigo is an infection of the skin. It commonly occurs in young children, but it can also occur in adults. The infection causes itchy blisters and sores that produce brownish-yellow fluid. As the fluid dries, it forms a thick, honey-colored crust. These skin changes usually occur on the face but can also affect other areas of the body. Impetigo usually goes away in 7-10 days with treatment.  CAUSES  Impetigo is caused by two types of bacteria. It may be caused by staphylococci or streptococci bacteria. These bacteria cause impetigo when they get under the surface of the skin. This often happens after some damage to the skin, such as damage from:  · Cuts, scrapes, or scratches.  · Insect bites, especially when you scratch the area of a bite.  · Chickenpox or other illnesses that cause open skin sores.  · Nail biting or chewing.  Impetigo is contagious and can spread easily from one person to another. This may occur through close skin contact or by sharing towels, clothing, or other items with a person who has the infection.  RISK FACTORS  Some things that can increase the risk of getting this infection include:  · Playing sports that include skin-to-skin contact with others.  · Having a skin condition with open sores.  · Having many skin cuts or scrapes.  · Living in an area that has high humidity levels.  · Having poor hygiene.  · Having high levels of staphylococci in your nose.  SIGNS AND SYMPTOMS  Impetigo usually starts out as small blisters, often on the face. The blisters then break open and turn into tiny sores (lesions) with a yellow crust. In some cases, the blisters cause itching or burning. With scratching, irritation, or lack of treatment, these small lesions may get larger. Scratching can also cause impetigo to spread to other parts of the body. The bacteria can get under the fingernails and spread when you touch another area of your skin.  Other possible symptoms include:  · Larger  blisters.  · Pus.  · Swollen lymph glands.  DIAGNOSIS  This condition is usually diagnosed during a physical exam. A skin sample or sample of fluid from a blister may be taken for lab tests that involve growing bacteria (culture test). This can help confirm the diagnosis or help determine the best treatment.  TREATMENT  Mild impetigo can be treated with prescription antibiotic cream. Oral antibiotic medicine may be used in more severe cases. Medicines for itching may also be used.  HOME CARE INSTRUCTIONS  · Take medicines only as directed by your health care provider.  · To help prevent impetigo from spreading to other body areas:    Keep your fingernails short and clean.    Do not scratch the blisters or sores.    Cover infected areas, if necessary, to keep from scratching.  · Gently wash the infected areas with antibiotic soap and water.  · Soak crusted areas in warm, soapy water using antibiotic soap.    Gently rub the areas to remove crusts. Do not scrub.  · Wash your hands often to avoid spreading this infection.  · Stay home until you have used an antibiotic cream for 48 hours (2 days) or an oral antibiotic medicine for 24 hours (1 day). You should only return to work and activities with other people if your skin shows significant improvement.  PREVENTION   To keep the infection from spreading:  · Stay home until you have used   an antibiotic cream for 48 hours or an oral antibiotic for 24 hours.  · Wash your hands often.  · Do not engage in skin-to-skin contact with other people while you have still have blisters.  · Do not share towels, washcloths, or bedding with others while you have the infection.  SEEK MEDICAL CARE IF:  · You develop more blisters or sores despite treatment.  · Other family members get sores.  · Your skin sores are not improving after 48 hours of treatment.  · You have a fever.  SEEK IMMEDIATE MEDICAL CARE IF:  · You see spreading redness or swelling of the skin around your sores.  · You  see red streaks coming from your sores.  · You develop a sore throat.     This information is not intended to replace advice given to you by your health care provider. Make sure you discuss any questions you have with your health care provider.     Document Released: 11/28/2014 Document Reviewed: 11/28/2014  Elsevier Interactive Patient Education ©2016 [employer]

## 2016-06-03 NOTE — ED Notes (Signed)
Patient to ER for c/o rash to inner thighs. States daughter has impetigo, believes she may have it now as well. Blistering rash present.

## 2016-06-03 NOTE — ED Provider Notes (Signed)
[facility] Emergency Department Provider Note  ____________________________________________  Time seen: Approximately 5:23 PM  I have reviewed the triage vital signs and the nursing notes.   HISTORY  Chief Complaint Rash    HPI Kellie Mejia is a 25 y.o. female who presents emergency department with skin lesions to the right medial thigh. Patient's daughter has impetigo and is on antibiotics for same. Patient reports that lesions form similarly to her daughter and are now oozing and crusting over with a golden color crust. Patient reports the area itches. She denies any pain to the area. Patient denies any other symptoms include fevers chills, abdominal pain, nausea or vomiting.   Past Medical History  Diagnosis Date  . Anxiety   . Depression   . Asthma     excercise induced  . GERD (gastroesophageal reflux disease)     Patient Active Problem List   Diagnosis Date Noted  . Postoperative state 07/17/2015  . Labor and delivery, indication for care 07/16/2015  . Pregnancy 07/10/2015  . Abdominal pain 07/01/2015  . Abdominal pain affecting pregnancy 06/14/2015  . Indication for care in labor and delivery, antepartum 05/27/2015    Past Surgical History  Procedure Laterality Date  . Wisdom tooth extraction    . Cesarean section N/A 07/17/2015    Procedure: CESAREAN SECTION;  Surgeon: Suzy Bouchardhomas J Schermerhorn, MD;  Location: [facility] ORS;  Service: Obstetrics;  Laterality: N/A;    Current Outpatient Rx  Name  Route  Sig  Dispense  Refill  . albuterol (PROVENTIL HFA;VENTOLIN HFA) 108 (90 Base) MCG/ACT inhaler   Inhalation   Inhale 2 puffs into the lungs every 6 (six) hours as needed for wheezing or shortness of breath.   1 Inhaler   2   . albuterol-ipratropium (COMBIVENT) 18-103 MCG/ACT inhaler   Inhalation   Inhale 1 puff into the lungs every 6 (six) hours as needed for wheezing or shortness of breath.   1 Inhaler   2   . benzonatate  (TESSALON PERLES) 100 MG capsule   Oral   Take 1 capsule (100 mg total) by mouth 3 (three) times daily as needed for cough.   21 capsule   0   . cephALEXin (KEFLEX) 500 MG capsule   Oral   Take 1 capsule (500 mg total) by mouth 4 (four) times daily.   28 capsule   0   . predniSONE (DELTASONE) 10 MG tablet   Oral   Take 5 tablets (50 mg total) by mouth daily with breakfast.   25 tablet   0   . triamcinolone cream (KENALOG) 0.1 %   Topical   Apply 1 application topically 4 (four) times daily.   30 g   0     Allergies Review of patient's allergies indicates no known allergies.  Family History  Problem Relation Age of Onset  . Hypertension Maternal Grandfather     Social History Social History  Substance Use Topics  . Smoking status: Never Smoker   . Smokeless tobacco: Never Used  . Alcohol Use: No     Review of Systems  Constitutional: No fever/chills Cardiovascular: no chest pain. Respiratory: no cough. No SOB. Musculoskeletal: Negative for musculoskeletal pain. Skin: Positive for skin lesions to the right medial thigh. Neurological: Negative for headaches, focal weakness or numbness. 10-point ROS otherwise negative.  ____________________________________________   PHYSICAL EXAM:  VITAL SIGNS: ED Triage Vitals  Enc Vitals Group     BP 06/03/16 1705 128/88 mmHg  Pulse Rate 06/03/16 1705 84     Resp 06/03/16 1705 18     Temp 06/03/16 1705 98.2 F (36.8 C)     Temp Source 06/03/16 1705 Oral     SpO2 06/03/16 1705 97 %     Weight 06/03/16 1705 200 lb (90.719 kg)     Height 06/03/16 1705  (1.626 m)     Head Cir --      Peak Flow --      Pain Score --      Pain Loc --      Pain Edu? --      Excl. in GC? --      Constitutional: Alert and oriented. Well appearing and in no acute distress. Eyes: Conjunctivae are normal. PERRL. EOMI. Head: Atraumatic. Cardiovascular: Normal rate, regular rhythm. Normal S1 and S2.  Good peripheral  circulation. Respiratory: Normal respiratory effort without tachypnea or retractions. Lungs CTAB. Good air entry to the bases with no decreased or absent breath sounds. Musculoskeletal: Full range of motion to all extremities. No gross deformities appreciated. Neurologic:  Normal speech and language. No gross focal neurologic deficits are appreciated.  Skin:  Skin is warm, dry and intact. No rash noted. Using skin lesions are noted to the right medial thigh. Multiple lesions are noted the largest of which is approximately 5 cm x 3 cm. Area is both visiting and is crusted over with a honey colored crust. Minor excoriations are noted in the surrounding skin from scratching. Area is nontender to palpation. No fluctuance noted to palpation. Psychiatric: Mood and affect are normal. Speech and behavior are normal. Patient exhibits appropriate insight and judgement.   ____________________________________________   LABS (all labs ordered are listed, but only abnormal results are displayed)  Labs Reviewed - No data to display ____________________________________________  EKG   ____________________________________________  RADIOLOGY   No results found.  ____________________________________________    PROCEDURES  Procedure(s) performed:       Medications - No data to display   ____________________________________________   INITIAL IMPRESSION / ASSESSMENT AND PLAN / ED COURSE  Pertinent labs & imaging results that were available during my care of the patient were reviewed by me and considered in my medical decision making (see chart for details).  Patient's diagnosis is consistent with impetigo. Patient's daughter has been recently diagnosed with same and patient now exhibits same symptoms.. Patient will be discharged home with prescriptions for antibiotics. Patient is to follow up with primary care provider as needed or otherwise directed. Patient is given ED precautions to  return to the ED for any worsening or new symptoms.     ____________________________________________  FINAL CLINICAL IMPRESSION(S) / ED DIAGNOSES  Final diagnoses:  Impetigo      NEW MEDICATIONS STARTED DURING THIS VISIT:  New Prescriptions   CEPHALEXIN (KEFLEX) 500 MG CAPSULE    Take 1 capsule (500 mg total) by mouth 4 (four) times daily.   TRIAMCINOLONE CREAM (KENALOG) 0.1 %    Apply 1 application topically 4 (four) times daily.        This chart was dictated using voice recognition software/Dragon. Despite best efforts to proofread, errors can occur which can change the meaning. Any change was purely unintentional.    Racheal Patches, PA-C 06/03/16 1727  Governor Rooks, MD 06/03/16 2156

## 2016-06-24 ENCOUNTER — Emergency Department
Admission: EM | Admit: 2016-06-24 | Discharge: 2016-06-24 | Disposition: A | Discharge status: Home / Self Care | Payer: Medicaid Other | Attending: Emergency Medicine | Admitting: Emergency Medicine

## 2016-06-24 ENCOUNTER — Encounter: Payer: Self-pay | Admitting: Emergency Medicine

## 2016-06-24 DIAGNOSIS — L739 Follicular disorder, unspecified: Secondary | ICD-10-CM | POA: Insufficient documentation

## 2016-06-24 DIAGNOSIS — J45909 Unspecified asthma, uncomplicated: Secondary | ICD-10-CM | POA: Insufficient documentation

## 2016-06-24 MED ORDER — TRIAMCINOLONE ACETONIDE 0.1 % EX CREA: 1.0000 "application " | TOPICAL_CREAM | Freq: Four times a day (QID) | CUTANEOUS | 3 refills | Status: DC

## 2016-06-24 MED ORDER — SULFAMETHOXAZOLE-TRIMETHOPRIM 800-160 MG PO TABS: 1.0000 | ORAL_TABLET | Freq: Two times a day (BID) | ORAL | 0 refills | Status: DC

## 2016-06-24 NOTE — ED Notes (Signed)
Provider in to see, assess and discharge patient prior to RN, please see PA note for assessment.

## 2016-06-24 NOTE — ED Triage Notes (Signed)
Patient presents to the ED with itching from her knees to her neck.  Patient reports history of impetigo and her child having impetigo and states this feels the same.  Patient ambulatory to traige with no obvious distress at this time.

## 2016-06-24 NOTE — ED Notes (Signed)
Discharge instructions reviewed with patient. Patient verbalized understanding. Patient ambulated to lobby without difficulty.   

## 2016-06-24 NOTE — ED Provider Notes (Signed)
[facility] Emergency Department Provider Note  ____________________________________________  Time seen: Approximately 5:40 PM  I have reviewed the triage vital signs and the nursing notes.   HISTORY  Chief Complaint Rash and Anal Itching    HPI Kellie Mejia is a 25 y.o. female resents emergency department complaining of multiple skin lesions to the eyes, back, neck. Patient was seen by myself and diagnosed withimpetigo on 06/03/2016. Patient had contracted this condition from her 58-month-old daughter. The patient reports that lesions had almost completely cleared from oral antibiotics prescribed. Patient was on Keflex. Patient reports that she showed her legs and "spread it to the rest of my legs. Patient has been scratching at same and now has lesions to medial thighs, back spreading to her neck. Patient reports that area is erythematous, mildly edematous. No drainage. No crusting. Area is also pruritic in nature.   Past Medical History:  Diagnosis Date  . Anxiety   . Asthma    excercise induced  . Depression   . GERD (gastroesophageal reflux disease)     Patient Active Problem List   Diagnosis Date Noted  . Postoperative state 07/17/2015  . Labor and delivery, indication for care 07/16/2015  . Pregnancy 07/10/2015  . Abdominal pain 07/01/2015  . Abdominal pain affecting pregnancy 06/14/2015  . Indication for care in labor and delivery, antepartum 05/27/2015    Past Surgical History:  Procedure Laterality Date  . CESAREAN SECTION N/A 07/17/2015   Procedure: CESAREAN SECTION;  Surgeon: Suzy Bouchard, MD;  Location: [facility] ORS;  Service: Obstetrics;  Laterality: N/A;  . WISDOM TOOTH EXTRACTION      Prior to Admission medications   Medication Sig Start Date End Date Taking? Authorizing Provider  albuterol (PROVENTIL HFA;VENTOLIN HFA) 108 (90 Base) MCG/ACT inhaler Inhale 2 puffs into the lungs every 6 (six) hours as needed for  wheezing or shortness of breath. 02/17/16   Evangeline Dakin, PA-C  albuterol-ipratropium (COMBIVENT) 18-103 MCG/ACT inhaler Inhale 1 puff into the lungs every 6 (six) hours as needed for wheezing or shortness of breath. 03/01/16   Charmayne Sheer Beers, PA-C  sulfamethoxazole-trimethoprim (BACTRIM DS,SEPTRA DS) 800-160 MG tablet Take 1 tablet by mouth 2 (two) times daily. 06/24/16   Delorise Royals Rhapsody Wolven, PA-C  triamcinolone cream (KENALOG) 0.1 % Apply 1 application topically 4 (four) times daily. 06/24/16   Delorise Royals Meilah Delrosario, PA-C    Allergies Review of patient's allergies indicates no known allergies.  Family History  Problem Relation Age of Onset  . Hypertension Maternal Grandfather     Social History Social History  Substance Use Topics  . Smoking status: Never Smoker  . Smokeless tobacco: Never Used  . Alcohol use No     Review of Systems  Constitutional: No fever/chills Cardiovascular: no chest pain. Respiratory: no cough. No SOB. Musculoskeletal: Negative for musculoskeletal pain. Skin: Positive for erythematous skin lesions to the thighs and back. Neurological: Negative for headaches, focal weakness or numbness. 10-point ROS otherwise negative.  ____________________________________________   PHYSICAL EXAM:  VITAL SIGNS: ED Triage Vitals  Enc Vitals Group     BP 06/24/16 1720 137/89     Pulse Rate 06/24/16 1720 95     Resp 06/24/16 1720 18     Temp 06/24/16 1720 98.4 F (36.9 C)     Temp Source 06/24/16 1720 Oral     SpO2 06/24/16 1720 98 %     Weight 06/24/16 1720 200 lb (90.7 kg)     Height 06/24/16 1720  5\' 4"  (1.626 m)     Head Circumference --      Peak Flow --      Pain Score 06/24/16 1717 0     Pain Loc --      Pain Edu? --      Excl. in GC? --      Constitutional: Alert and oriented. Well appearing and in no acute distress. Eyes: Conjunctivae are normal. PERRL. EOMI. Head: Atraumatic. Cardiovascular: Normal rate, regular rhythm. Normal S1 and S2.  Good  peripheral circulation. Respiratory: Normal respiratory effort without tachypnea or retractions. Lungs CTAB. Good air entry to the bases with no decreased or absent breath sounds. Musculoskeletal: Full range of motion to all extremities. No gross deformities appreciated. Neurologic:  Normal speech and language. No gross focal neurologic deficits are appreciated.  Skin:  Skin is warm, dry and intact. No rash noted. Erythematous skin lesions noted to medial thighs, back. Areas of excoriation from scratching. Areas are nontender to palpation. No fluctuance. No drainage. Skin lesions are consistent with folliculitis. Psychiatric: Mood and affect are normal. Speech and behavior are normal. Patient exhibits appropriate insight and judgement.   ____________________________________________   LABS (all labs ordered are listed, but only abnormal results are displayed)  Labs Reviewed - No data to display ____________________________________________  EKG   ____________________________________________  RADIOLOGY   No results found.  ____________________________________________    PROCEDURES  Procedure(s) performed:    Procedures    Medications - No data to display   ____________________________________________   INITIAL IMPRESSION / ASSESSMENT AND PLAN / ED COURSE  Pertinent labs & imaging results that were available during my care of the patient were reviewed by me and considered in my medical decision making (see chart for details).  Clinical Course    Patient's diagnosis is consistent with Folliculitis. This likely occurred from using a razor in area from previous impetigo. At this point there is no indication of impetigo. This appears to be folliculitis. Patient will be treated with antibiotics. She will be placed on 10 days worth of Bactrim since she was recently on Keflex for impetigo. Patient will also be given triamcinolone ointment for symptomatic control of itching..   Patient is to follow up with primary care provider as needed or otherwise directed. Patient is given ED precautions to return to the ED for any worsening or new symptoms.     ____________________________________________  FINAL CLINICAL IMPRESSION(S) / ED DIAGNOSES  Final diagnoses:  Folliculitis      NEW MEDICATIONS STARTED DURING THIS VISIT:  Discharge Medication List as of 06/24/2016  6:07 PM    START taking these medications   Details  sulfamethoxazole-trimethoprim (BACTRIM DS,SEPTRA DS) 800-160 MG tablet Take 1 tablet by mouth 2 (two) times daily., Starting Fri 06/24/2016, Print            This chart was dictated using voice recognition software/Dragon. Despite best efforts to proofread, errors can occur which can change the meaning. Any change was purely unintentional.    Racheal Patches, PA-C 06/24/16 1839    Nita Sickle, MD 06/25/16 [phone]

## 2016-07-23 ENCOUNTER — Emergency Department
Admission: EM | Admit: 2016-07-23 | Discharge: 2016-07-23 | Disposition: A | Discharge status: Home / Self Care | Payer: Self-pay | Attending: Emergency Medicine | Admitting: Emergency Medicine

## 2016-07-23 ENCOUNTER — Emergency Department: Payer: Self-pay

## 2016-07-23 DIAGNOSIS — M7701 Medial epicondylitis, right elbow: Secondary | ICD-10-CM | POA: Insufficient documentation

## 2016-07-23 DIAGNOSIS — J45909 Unspecified asthma, uncomplicated: Secondary | ICD-10-CM | POA: Insufficient documentation

## 2016-07-23 DIAGNOSIS — G5621 Lesion of ulnar nerve, right upper limb: Secondary | ICD-10-CM | POA: Insufficient documentation

## 2016-07-23 MED ORDER — KETOROLAC TROMETHAMINE 60 MG/2ML IM SOLN
60.0000 mg | Freq: Once | INTRAMUSCULAR | Status: AC
  Administered 2016-07-23: 60 mg via INTRAMUSCULAR

## 2016-07-23 MED ORDER — MELOXICAM 15 MG PO TABS: 15.0000 mg | ORAL_TABLET | Freq: Every day | ORAL | 0 refills | Status: DC

## 2016-07-23 MED ORDER — KETOROLAC TROMETHAMINE 60 MG/2ML IM SOLN
INTRAMUSCULAR | Status: AC
  Administered 2016-07-23: 60 mg via INTRAMUSCULAR
  Filled 2016-07-23: qty 2

## 2016-07-23 NOTE — ED Notes (Signed)
Pt reports to ED w/ c/o R elbow pain that began today after injuring it while cleaning.  Pt sts she "twisted" it while reaching under couch.  Pt able to move arm, pain w/ palpation.  Sensation, circulation and motor function intact.  NAD.

## 2016-07-24 NOTE — ED Provider Notes (Signed)
[facility] Emergency Department Provider Note  ____________________________________________  Time seen: Approximately 2245 AM  I have reviewed the triage vital signs and the nursing notes.   HISTORY  Chief Complaint Arm Injury    HPI Kellie Mejia is a 25 y.o. female presents emergency department complaining of right elbow pain. Patient states that she was trying to reach an item underneath her couch when she twisted her arm and felt a immediate pain to the medial aspect of the elbow. Patient states that initially she thought she just strained her elbow. However, pain increased and caused numbness to the third, fourth, fifth digits of the right hand. Patient reports that the pain is severe with extreme flexion. She denies any loss of range of motion to elbow, wrist, digits of the right hand. She is tried Tylenol and Motrin at home with no relief. No other injury.   Past Medical History:  Diagnosis Date  . Anxiety   . Asthma    excercise induced  . Depression   . GERD (gastroesophageal reflux disease)     Patient Active Problem List   Diagnosis Date Noted  . Postoperative state 07/17/2015  . Labor and delivery, indication for care 07/16/2015  . Pregnancy 07/10/2015  . Abdominal pain 07/01/2015  . Abdominal pain affecting pregnancy 06/14/2015  . Indication for care in labor and delivery, antepartum 05/27/2015    Past Surgical History:  Procedure Laterality Date  . CESAREAN SECTION N/A 07/17/2015   Procedure: CESAREAN SECTION;  Surgeon: Suzy Bouchard, MD;  Location: [facility] ORS;  Service: Obstetrics;  Laterality: N/A;  . WISDOM TOOTH EXTRACTION      Prior to Admission medications   Medication Sig Start Date End Date Taking? Authorizing Provider  albuterol (PROVENTIL HFA;VENTOLIN HFA) 108 (90 Base) MCG/ACT inhaler Inhale 2 puffs into the lungs every 6 (six) hours as needed for wheezing or shortness of breath. 02/17/16   Evangeline Dakin,  PA-C  albuterol-ipratropium (COMBIVENT) 18-103 MCG/ACT inhaler Inhale 1 puff into the lungs every 6 (six) hours as needed for wheezing or shortness of breath. 03/01/16   Evangeline Dakin, PA-C  meloxicam (MOBIC) 15 MG tablet Take 1 tablet (15 mg total) by mouth daily. 07/23/16   Delorise Royals Tysheem Accardo, PA-C  sulfamethoxazole-trimethoprim (BACTRIM DS,SEPTRA DS) 800-160 MG tablet Take 1 tablet by mouth 2 (two) times daily. 06/24/16   Delorise Royals Reeva Davern, PA-C  triamcinolone cream (KENALOG) 0.1 % Apply 1 application topically 4 (four) times daily. 06/24/16   Delorise Royals Recardo Linn, PA-C    Allergies Review of patient's allergies indicates no known allergies.  Family History  Problem Relation Age of Onset  . Hypertension Maternal Grandfather     Social History Social History  Substance Use Topics  . Smoking status: Never Smoker  . Smokeless tobacco: Never Used  . Alcohol use No     Review of Systems  Constitutional: No fever/chills Cardiovascular: no chest pain. Respiratory: no cough. No SOB. Musculoskeletal: Positive for right elbow pain Skin: Negative for rash, abrasions, lacerations, ecchymosis. Neurological: Negative for headaches, focal weakness or numbness. 10-point ROS otherwise negative.  ____________________________________________   PHYSICAL EXAM:  VITAL SIGNS: ED Triage Vitals [07/23/16 2234]  Enc Vitals Group     BP (!) 145/90     Pulse Rate 65     Resp 16     Temp 98.2 F (36.8 C)     Temp Source Oral     SpO2 98 %     Weight 200  lb (90.7 kg)     Height 5\' 4"  (1.626 m)     Head Circumference      Peak Flow      Pain Score 2     Pain Loc      Pain Edu?      Excl. in GC?      Constitutional: Alert and oriented. Well appearing and in no acute distress. Eyes: Conjunctivae are normal. PERRL. EOMI. Head: Atraumatic. Cardiovascular: Normal rate, regular rhythm. Normal S1 and S2.  Good peripheral circulation. Respiratory: Normal respiratory effort without  tachypnea or retractions. Lungs CTAB. Good air entry to the bases with no decreased or absent breath sounds. Musculoskeletal: Full range of motion to all extremities. No gross deformities appreciated. No visible deformity or edema noted to inspection. Full range of motion in elbow. Patient is very tender to palpation over the cubital tunnel region as well as the medial epicondyle. No palpable abnormality. Examination of the shoulder and wrist are unremarkable. Equal grip strength distally. Sensation intact and equal upper extremities. Radial pulses intact and equal lower extremities. Neurologic:  Normal speech and language. No gross focal neurologic deficits are appreciated.  Skin:  Skin is warm, dry and intact. No rash noted. Psychiatric: Mood and affect are normal. Speech and behavior are normal. Patient exhibits appropriate insight and judgement.   ____________________________________________   LABS (all labs ordered are listed, but only abnormal results are displayed)  Labs Reviewed - No data to display ____________________________________________  EKG   ____________________________________________  RADIOLOGY Festus BarrenI, Keirstin Musil D Katti Pelle, personally viewed and evaluated these images (plain radiographs) as part of my medical decision making, as well as reviewing the written report by the radiologist.  Dg Elbow Complete Right  Result Date: 07/23/2016 CLINICAL DATA:  Medial pain after cleaning with twisting injury. EXAM: RIGHT ELBOW - COMPLETE 3+ VIEW COMPARISON:  None. FINDINGS: There is no evidence of fracture, dislocation, or joint effusion. There is no evidence of arthropathy or other focal bone abnormality. Soft tissues are unremarkable. IMPRESSION: Negative. Electronically Signed   By: Burman NievesWilliam  Stevens M.D.   On: 07/23/2016 23:31    ____________________________________________    PROCEDURES  Procedure(s) performed:    Procedures    Medications  ketorolac (TORADOL) injection  60 mg (60 mg Intramuscular Given 07/23/16 2343)     ____________________________________________   INITIAL IMPRESSION / ASSESSMENT AND PLAN / ED COURSE  Pertinent labs & imaging results that were available during my care of the patient were reviewed by me and considered in my medical decision making (see chart for details).  Review of the [place] CSRS was performed in accordance of the [facility] prior to dispensing any controlled drugs.  Clinical Course    Patient's diagnosis is consistent with Medial epicondylitis and cubital Tunnel syndrome. Exam is reassuring. No acute osseous abnormality on x-ray.. Patient will be discharged home with prescriptions for anti-inflammatories for symptom control.. Patient is to follow up with orthopedics if symptoms persist. Patient is given ED precautions to return to the ED for any worsening or new symptoms.     ____________________________________________  FINAL CLINICAL IMPRESSION(S) / ED DIAGNOSES  Final diagnoses:  Cubital tunnel syndrome, right  Medial epicondylitis of elbow, right      NEW MEDICATIONS STARTED DURING THIS VISIT:  Discharge Medication List as of 07/23/2016 11:45 PM    START taking these medications   Details  meloxicam (MOBIC) 15 MG tablet Take 1 tablet (15 mg total) by mouth daily., Starting Sat 07/23/2016, Print  This chart was dictated using voice recognition software/Dragon. Despite best efforts to proofread, errors can occur which can change the meaning. Any change was purely unintentional.    Racheal Patches, PA-C 07/24/16 0121    Loleta Rose, MD 07/24/16 [phone]

## 2016-08-18 IMAGING — US US OB < 14 WEEKS
1 series · 14 of 28 positions shown · non-contrast
Comparison: None.

CLINICAL DATA: Vaginal bleeding. Gestational age by LMP of 10 weeks
6 days.

EXAM:
OBSTETRIC <14 WK ULTRASOUND
TECHNIQUE: Transabdominal ultrasound was performed for evaluation of the
gestation as well as the maternal uterus and adnexal regions.

[Series 1: us ob < 14 weeks · 0.26mm/px · 14 of 61 slices shown]
[im 3/61]
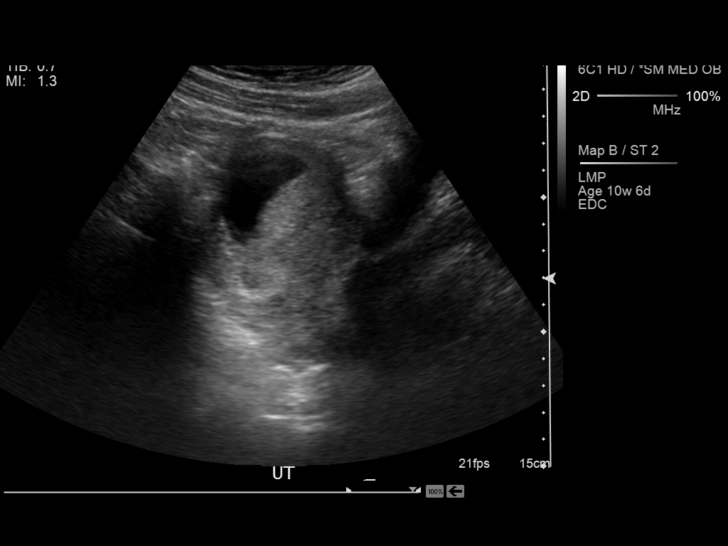
[im 7/61]
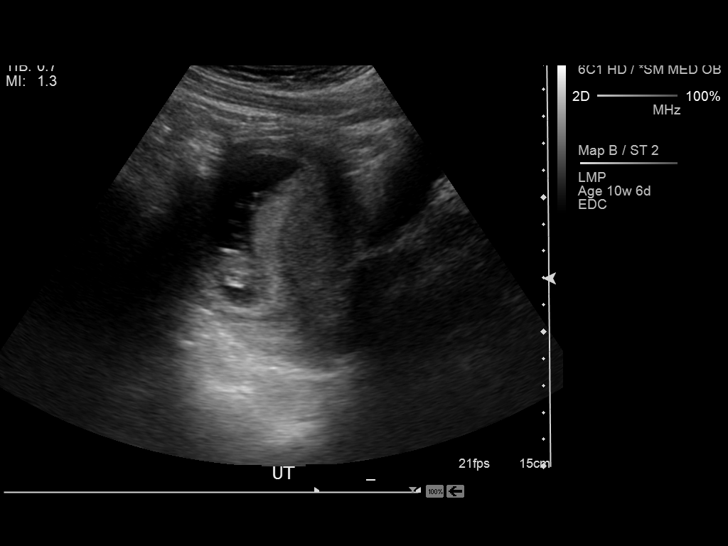
[im 12/61]
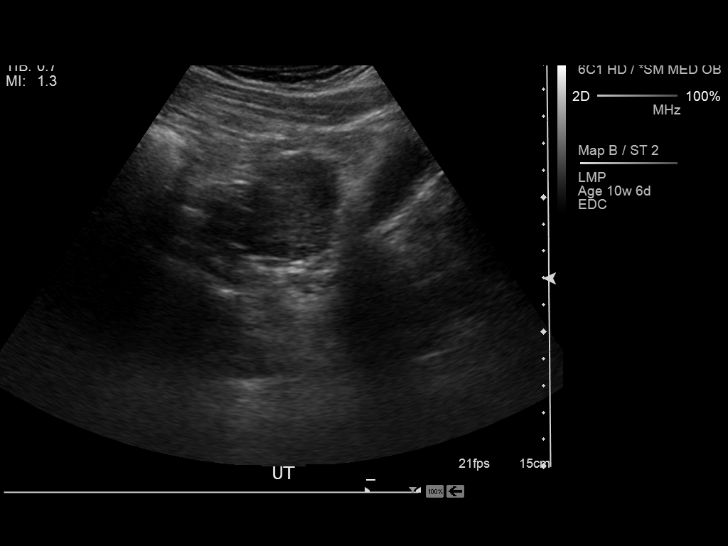
[im 16/61]
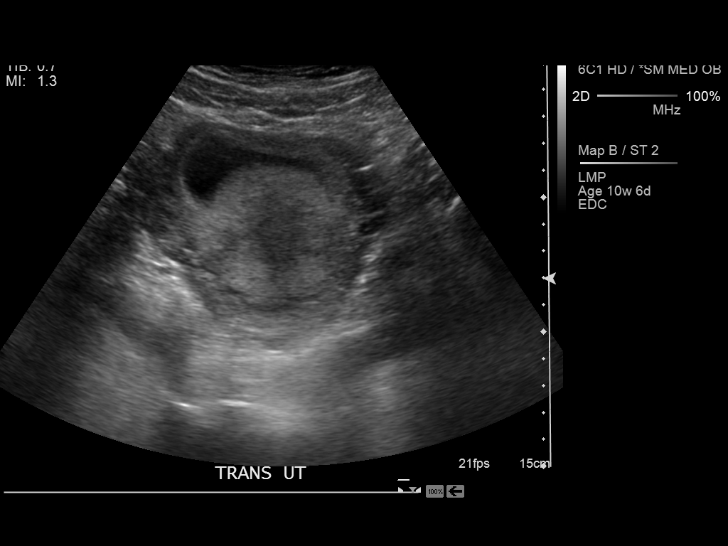
[im 21/61]
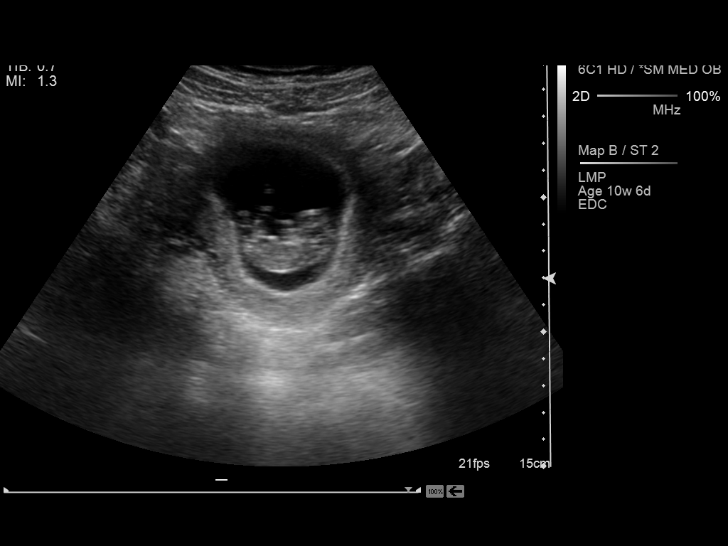
[im 25/61]
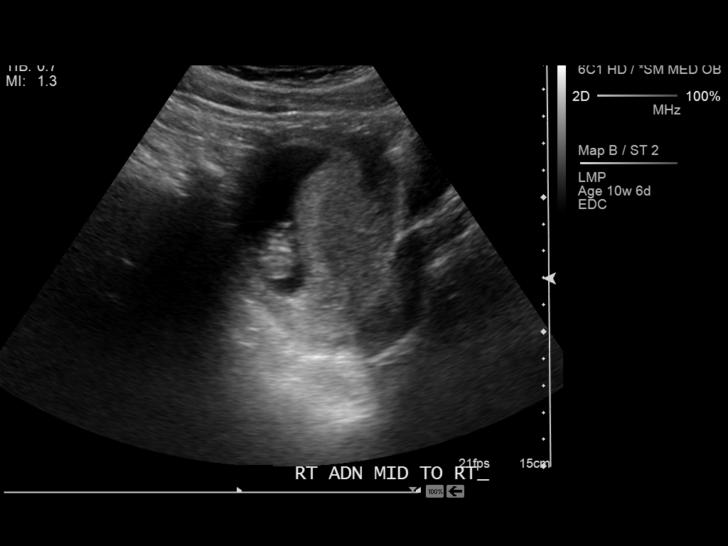
[im 29/61]
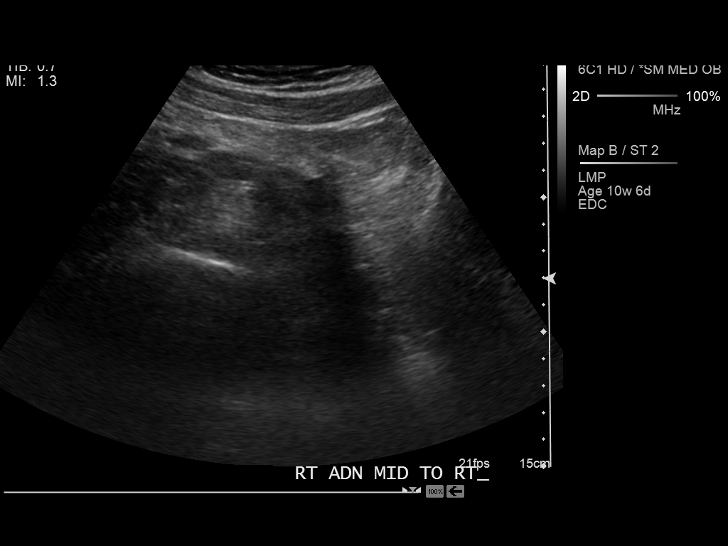
[im 34/61]
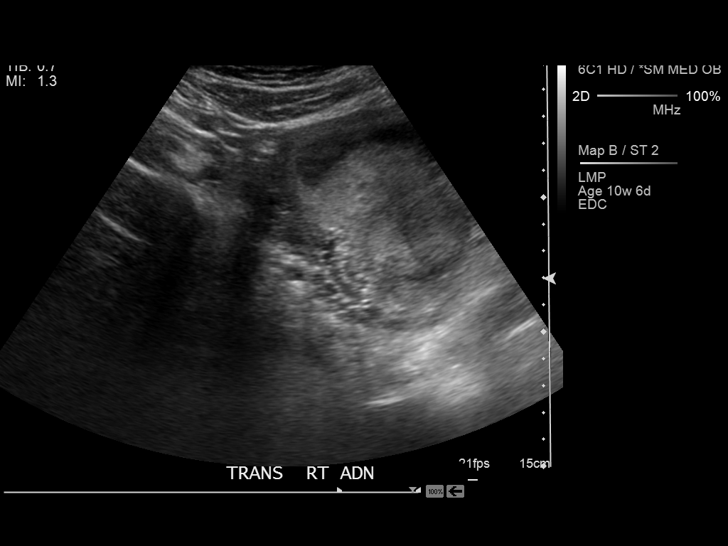
[im 38/61]
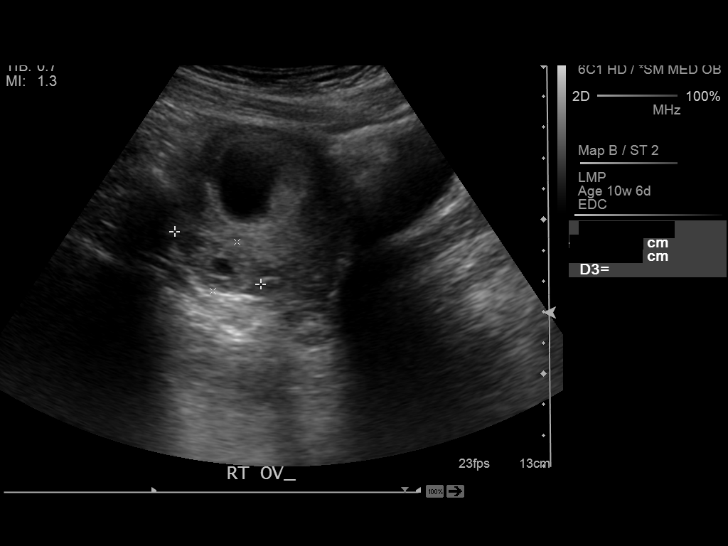
[im 43/61]
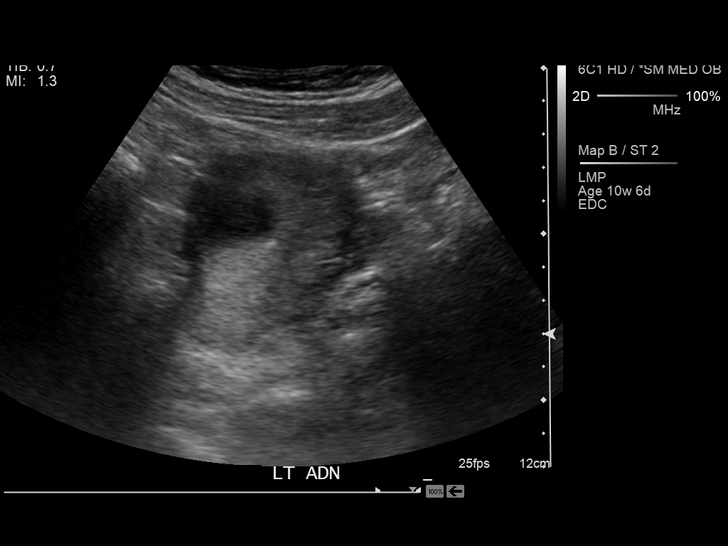
[im 47/61]
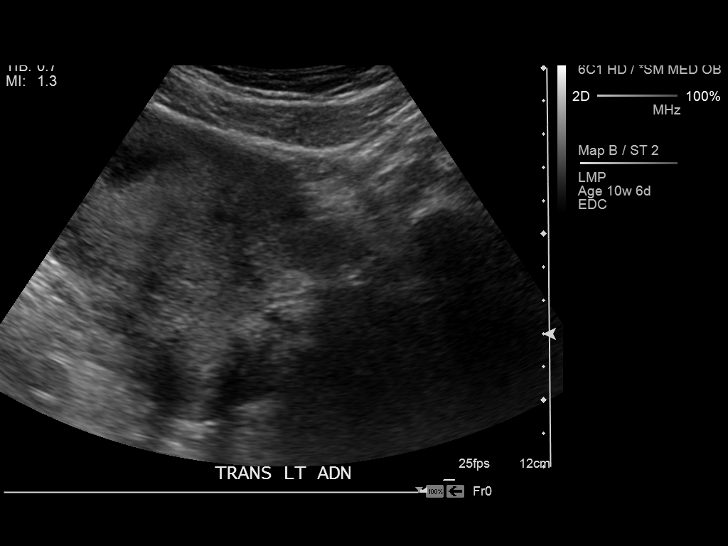
[im 52/61]
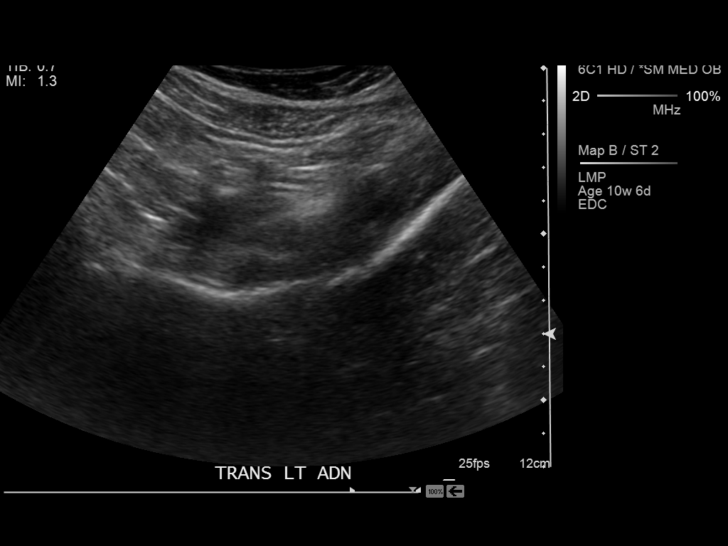
[im 56/61]
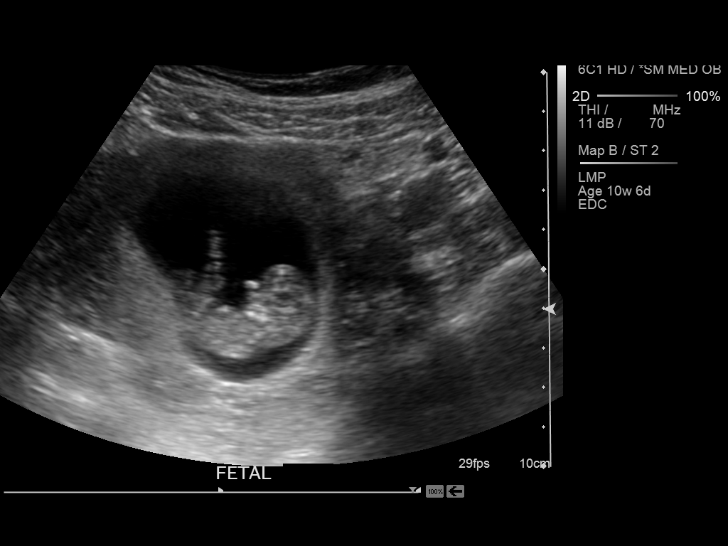
[im 61/61]
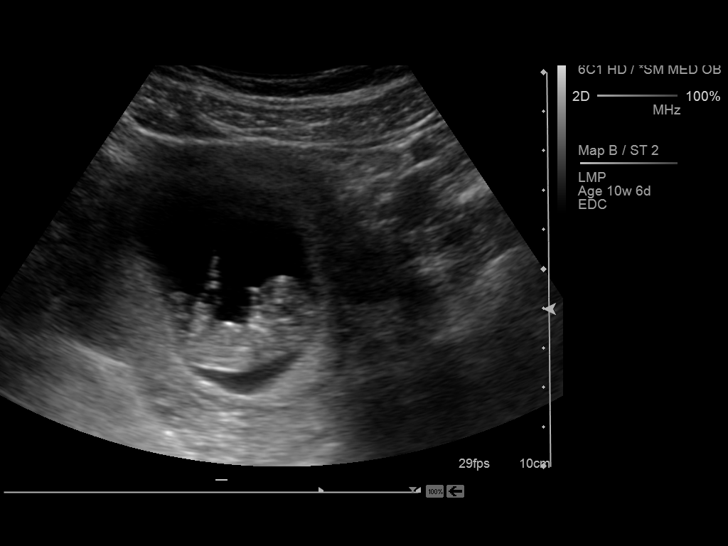

[14 of 28 positions shown; findings below may reference images not displayed]

FINDINGS: Intrauterine gestational sac: Visualized/normal in shape.

Yolk sac:  Not visualized

Embryo:  Visualized

Cardiac Activity: Visualized

Heart Rate: 173 bpm

CRL:   34  mm   10 w 2 d                  US EDC: 07/12/2015

Maternal uterus/adnexae: Both ovaries are normal in appearance. No
mass or free fluid identified.
IMPRESSION: Single living IUP measuring 10 weeks 2 days with US EDC of
07/12/2015.

No significant maternal uterine or adnexal abnormality identified.

## 2017-07-19 ENCOUNTER — Emergency Department
Admission: EM | Admit: 2017-07-19 | Discharge: 2017-07-19 | Disposition: A | Discharge status: Home / Self Care | Payer: Medicaid Other | Attending: Emergency Medicine | Admitting: Emergency Medicine

## 2017-07-19 ENCOUNTER — Encounter: Payer: Self-pay | Admitting: *Deleted

## 2017-07-19 DIAGNOSIS — H16002 Unspecified corneal ulcer, left eye: Secondary | ICD-10-CM | POA: Insufficient documentation

## 2017-07-19 DIAGNOSIS — Z79899 Other long term (current) drug therapy: Secondary | ICD-10-CM | POA: Insufficient documentation

## 2017-07-19 DIAGNOSIS — B0052 Herpesviral keratitis: Secondary | ICD-10-CM

## 2017-07-19 DIAGNOSIS — J45909 Unspecified asthma, uncomplicated: Secondary | ICD-10-CM | POA: Diagnosis not present

## 2017-07-19 DIAGNOSIS — H5712 Ocular pain, left eye: Secondary | ICD-10-CM | POA: Diagnosis present

## 2017-07-19 MED ORDER — CIPROFLOXACIN HCL 0.3 % OP SOLN
2.0000 [drp] | Freq: Once | OPHTHALMIC | Status: AC
  Administered 2017-07-19: 2 [drp] via OPHTHALMIC
  Filled 2017-07-19: qty 2.5

## 2017-07-19 MED ORDER — FLUORESCEIN SODIUM 0.6 MG OP STRP
1.0000 | ORAL_STRIP | Freq: Once | OPHTHALMIC | Status: AC
  Administered 2017-07-19: 1 via OPHTHALMIC
  Filled 2017-07-19: qty 1

## 2017-07-19 MED ORDER — CIPROFLOXACIN HCL 0.3 % OP SOLN: 1.0000 [drp] | OPHTHALMIC | 0 refills | Status: AC

## 2017-07-19 MED ORDER — KETOROLAC TROMETHAMINE 0.5 % OP SOLN: 1.0000 [drp] | Freq: Four times a day (QID) | OPHTHALMIC | 0 refills | Status: DC

## 2017-07-19 MED ORDER — FLUORESCEIN SODIUM 0.6 MG OP STRP
ORAL_STRIP | OPHTHALMIC | Status: AC
  Filled 2017-07-19: qty 1

## 2017-07-19 MED ORDER — TETRACAINE HCL 0.5 % OP SOLN
OPHTHALMIC | Status: AC
  Filled 2017-07-19: qty 4

## 2017-07-19 MED ORDER — TETRACAINE HCL 0.5 % OP SOLN
2.0000 [drp] | Freq: Once | OPHTHALMIC | Status: AC
  Administered 2017-07-19: 2 [drp] via OPHTHALMIC
  Filled 2017-07-19: qty 4

## 2017-07-19 NOTE — ED Provider Notes (Signed)
[facility] Emergency Department Provider Note  ____________________________________________  Time seen: Approximately 3:27 PM  I have reviewed the triage vital signs and the nursing notes.   HISTORY  Chief Complaint Eye Pain    HPI Kellie Mejia is a 26 y.o. female who presents to emergency department complaining of redness, foreign body sensation, photosensitivity to the left eye. Symptoms have been ongoing 2 days after she left in her daily contacts for 3 days. Patient denies any pustular drainage. She denies any visual changes. No other complaints at this time. Patient has tried over-the-counter pinkeye drops with minimal relief.   Past Medical History:  Diagnosis Date  . Anxiety   . Asthma    excercise induced  . Depression   . GERD (gastroesophageal reflux disease)     Patient Active Problem List   Diagnosis Date Noted  . Postoperative state 07/17/2015  . Labor and delivery, indication for care 07/16/2015  . Pregnancy 07/10/2015  . Abdominal pain 07/01/2015  . Abdominal pain affecting pregnancy 06/14/2015  . Indication for care in labor and delivery, antepartum 05/27/2015    Past Surgical History:  Procedure Laterality Date  . CESAREAN SECTION N/A 07/17/2015   Procedure: CESAREAN SECTION;  Surgeon: Suzy Bouchard, MD;  Location: [facility] ORS;  Service: Obstetrics;  Laterality: N/A;  . WISDOM TOOTH EXTRACTION      Prior to Admission medications   Medication Sig Start Date End Date Taking? Authorizing Provider  albuterol (PROVENTIL HFA;VENTOLIN HFA) 108 (90 Base) MCG/ACT inhaler Inhale 2 puffs into the lungs every 6 (six) hours as needed for wheezing or shortness of breath. 02/17/16   Beers, Charmayne Sheer, PA-C  albuterol-ipratropium (COMBIVENT) 18-103 MCG/ACT inhaler Inhale 1 puff into the lungs every 6 (six) hours as needed for wheezing or shortness of breath. 03/01/16   Beers, Charmayne Sheer, PA-C  ciprofloxacin (CILOXAN) 0.3 %  ophthalmic solution Place 1 drop into the left eye every 2 (two) hours. Administer 1 drop, every 2 hours, while awake, for 2 days. Then 1 drop, every 4 hours, while awake, for the next 5 days. 07/19/17 07/24/17  Zulma Court, Delorise Royals, PA-C  ketorolac (ACULAR) 0.5 % ophthalmic solution Place 1 drop into the left eye 4 (four) times daily. 07/19/17   Nero Sawatzky, Delorise Royals, PA-C  meloxicam (MOBIC) 15 MG tablet Take 1 tablet (15 mg total) by mouth daily. 07/23/16   Shivank Pinedo, Delorise Royals, PA-C  sulfamethoxazole-trimethoprim (BACTRIM DS,SEPTRA DS) 800-160 MG tablet Take 1 tablet by mouth 2 (two) times daily. 06/24/16   Dio Giller, Delorise Royals, PA-C  triamcinolone cream (KENALOG) 0.1 % Apply 1 application topically 4 (four) times daily. 06/24/16   Teniola Tseng, Delorise Royals, PA-C    Allergies Patient has no known allergies.  Family History  Problem Relation Age of Onset  . Hypertension Maternal Grandfather     Social History Social History  Substance Use Topics  . Smoking status: Never Smoker  . Smokeless tobacco: Never Used  . Alcohol use No     Review of Systems  Constitutional: No fever/chills Eyes: No visual changes. No discharge. Positive for left eye redness, irritation, photosensitivity ENT: No upper respiratory complaints. Cardiovascular: no chest pain. Respiratory: no cough. No SOB. Gastrointestinal: No abdominal pain.  No nausea, no vomiting. Musculoskeletal: Negative for musculoskeletal pain. Skin: Negative for rash, abrasions, lacerations, ecchymosis. Neurological: Negative for headaches, focal weakness or numbness. 10-point ROS otherwise negative.  ____________________________________________   PHYSICAL EXAM:  VITAL SIGNS: ED Triage Vitals  Enc Vitals Group  BP 07/19/17 1454 117/82     Pulse Rate 07/19/17 1454 67     Resp 07/19/17 1454 16     Temp 07/19/17 1454 98.5 F (36.9 C)     Temp Source 07/19/17 1454 Oral     SpO2 07/19/17 1454 98 %     Weight 07/19/17 1454 200 lb  (90.7 kg)     Height 07/19/17 1454 5\' 4"  (1.626 m)     Head Circumference --      Peak Flow --      Pain Score 07/19/17 1453 0     Pain Loc --      Pain Edu? --      Excl. in GC? --      Constitutional: Alert and oriented. Well appearing and in no acute distress. Eyes: Conjunctivae are normal. PERRL. EOMI.Funduscopic exam reveals red reflex bilaterally. Past maternal optic disc unremarkable and affected eye. Eyes anesthetized using tetracaine drops. Fluorescein staining is applied and reveals area of uptake in the 11:00 position. Area of uptake is lacy in appearance, consistent with dendritic ulcer. Head: Atraumatic. ENT:      Ears:       Nose: No congestion/rhinnorhea.      Mouth/Throat: Mucous membranes are moist.  Neck: No stridor.    Cardiovascular: Normal rate, regular rhythm. Normal S1 and S2.  Good peripheral circulation. Respiratory: Normal respiratory effort without tachypnea or retractions. Lungs CTAB. Good air entry to the bases with no decreased or absent breath sounds. Musculoskeletal: Full range of motion to all extremities. No gross deformities appreciated. Neurologic:  Normal speech and language. No gross focal neurologic deficits are appreciated.  Skin:  Skin is warm, dry and intact. No rash noted. Psychiatric: Mood and affect are normal. Speech and behavior are normal. Patient exhibits appropriate insight and judgement.   ____________________________________________   LABS (all labs ordered are listed, but only abnormal results are displayed)  Labs Reviewed - No data to display ____________________________________________  EKG   ____________________________________________  RADIOLOGY   No results found.  ____________________________________________    PROCEDURES  Procedure(s) performed:    Procedures    Medications  ciprofloxacin (CILOXAN) 0.3 % ophthalmic solution 2 drop (not administered)  fluorescein ophthalmic strip 1 strip (1 strip  Left Eye Given 07/19/17 1521)  tetracaine (PONTOCAINE) 0.5 % ophthalmic solution 2 drop (2 drops Left Eye Given 07/19/17 1521)     ____________________________________________   INITIAL IMPRESSION / ASSESSMENT AND PLAN / ED COURSE  Pertinent labs & imaging results that were available during my care of the patient were reviewed by me and considered in my medical decision making (see chart for details).  Review of the [place] CSRS was performed in accordance of the [facility] prior to dispensing any controlled drugs.     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 This is consistent with wearing contacts longer than recommended.. Patient will be discharged home with prescriptions for Cipro eyedrops and Acular for symptom control. Patient is to follow up with ophthalmology as needed or otherwise directed. Patient is given ED precautions to return to the ED for any worsening or new symptoms.     ____________________________________________  FINAL CLINICAL IMPRESSION(S) / ED DIAGNOSES  Final diagnoses:  Dendritic ulcer      NEW MEDICATIONS STARTED DURING THIS VISIT:  New Prescriptions   CIPROFLOXACIN (CILOXAN) 0.3 % OPHTHALMIC SOLUTION    Place 1 drop into the left eye every 2 (two) hours. Administer 1 drop, every 2 hours, while  awake, for 2 days. Then 1 drop, every 4 hours, while awake, for the next 5 days.   KETOROLAC (ACULAR) 0.5 % OPHTHALMIC SOLUTION    Place 1 drop into the left eye 4 (four) times daily.        This chart was dictated using voice recognition software/Dragon. Despite best efforts to proofread, errors can occur which can change the meaning. Any change was purely unintentional.    Racheal Patches, PA-C 07/19/17 1531    Dionne Bucy, MD 07/19/17 [phone]

## 2017-07-19 NOTE — ED Triage Notes (Signed)
States her left eye feels as if there is "sandpaper" in it, states redness, states pain in the sun, awake and alert in no acute distress

## 2018-08-26 ENCOUNTER — Emergency Department: Payer: Self-pay

## 2018-08-26 ENCOUNTER — Other Ambulatory Visit: Payer: Self-pay

## 2018-08-26 ENCOUNTER — Encounter: Payer: Self-pay | Admitting: Emergency Medicine

## 2018-08-26 ENCOUNTER — Emergency Department
Admission: EM | Admit: 2018-08-26 | Discharge: 2018-08-26 | Disposition: A | Discharge status: Home / Self Care | Payer: Self-pay | Attending: Emergency Medicine | Admitting: Emergency Medicine

## 2018-08-26 DIAGNOSIS — Z79899 Other long term (current) drug therapy: Secondary | ICD-10-CM | POA: Insufficient documentation

## 2018-08-26 DIAGNOSIS — R1010 Upper abdominal pain, unspecified: Secondary | ICD-10-CM | POA: Insufficient documentation

## 2018-08-26 DIAGNOSIS — J45909 Unspecified asthma, uncomplicated: Secondary | ICD-10-CM | POA: Insufficient documentation

## 2018-08-26 LAB — COMPREHENSIVE METABOLIC PANEL
ALBUMIN: 4 g/dL (ref 3.5–5.0)
ALK PHOS: 59 U/L (ref 38–126)
ALT: 21 U/L (ref 0–44)
AST: 20 U/L (ref 15–41)
Anion gap: 8 (ref 5–15)
BUN: 11 mg/dL (ref 6–20)
CALCIUM: 8.5 mg/dL — AB (ref 8.9–10.3)
CO2: 24 mmol/L (ref 22–32)
CREATININE: 0.95 mg/dL (ref 0.44–1.00)
Chloride: 104 mmol/L (ref 98–111)
GFR calc Af Amer: >60 mL/min (ref >60)
GFR calc non Af Amer: >60 mL/min (ref >60)
GLUCOSE: 99 mg/dL (ref 70–99)
Potassium: 3.9 mmol/L (ref 3.5–5.1)
SODIUM: 136 mmol/L (ref 135–145)
Total Bilirubin: 0.8 mg/dL (ref 0.3–1.2)
Total Protein: 7.8 g/dL (ref 6.5–8.1)

## 2018-08-26 LAB — CBC
HCT: 44.2 % (ref 35.0–47.0)
Hemoglobin: 14.9 g/dL (ref 12.0–16.0)
MCH: 29.3 pg (ref 26.0–34.0)
MCHC: 33.7 g/dL (ref 32.0–36.0)
MCV: 86.7 fL (ref 80.0–100.0)
Platelets: 312 K/uL (ref 150–440)
RBC: 5.1 MIL/uL (ref 3.80–5.20)
RDW: 13.2 % (ref 11.5–14.5)
WBC: 12.4 K/uL — ABNORMAL HIGH (ref 3.6–11.0)

## 2018-08-26 LAB — TROPONIN I: Troponin I: <0.03 ng/mL

## 2018-08-26 MED ORDER — SUCRALFATE 1 GM/10ML PO SUSP: 1.0000 g | Freq: Four times a day (QID) | ORAL | 0 refills | Status: DC

## 2018-08-26 MED ORDER — ESOMEPRAZOLE MAGNESIUM 40 MG PO CPDR: 40.0000 mg | DELAYED_RELEASE_CAPSULE | Freq: Every day | ORAL | 1 refills | Status: DC

## 2018-08-26 MED ORDER — SUCRALFATE 1 G PO TABS
1.0000 g | ORAL_TABLET | Freq: Once | ORAL | Status: AC
  Administered 2018-08-26: 1 g via ORAL
  Filled 2018-08-26: qty 1

## 2018-08-26 MED ORDER — GI COCKTAIL ~~LOC~~
30.0000 mL | Freq: Once | ORAL | Status: AC
  Administered 2018-08-26: 30 mL via ORAL
  Filled 2018-08-26: qty 30

## 2018-08-26 NOTE — ED Notes (Signed)
Patient transported to X-ray 

## 2018-08-26 NOTE — ED Notes (Signed)
Reviewed discharge instructions, follow-up care, and prescriptions with patient. Patient verbalized understanding of all information reviewed. Patient stable, with no distress noted at this time.    

## 2018-08-26 NOTE — ED Provider Notes (Signed)
[facility] Emergency Department Provider Note   ____________________________________________   First MD Initiated Contact with Patient 08/26/18 [phone]     (approximate)  I have reviewed the triage vital signs and the nursing notes.   HISTORY  Chief Complaint Chest Pain    HPI Kellie Mejia is a 26 y.o. female patient reports she had a sore throat she went to [facility] clinic strep and mono were negative but she got some antibiotics and prednisone.  She was taking them and then developed pain up and down her chest.  It is worse if she swallows or takes a deep breath.  Worse if she eats.  Matter fact is so bad she says that she really has not been eating much.  She thought she had some thrush as well but managed to get rid of that by drinking vinegar and lemon juice.  Here in the emergency room she is having epigastric pain.  Is gets much better with a GI cocktail.   Past Medical History:  Diagnosis Date  . Anxiety   . Asthma    excercise induced  . Depression   . GERD (gastroesophageal reflux disease)     Patient Active Problem List   Diagnosis Date Noted  . Postoperative state 07/17/2015  . Labor and delivery, indication for care 07/16/2015  . Pregnancy 07/10/2015  . Abdominal pain 07/01/2015  . Abdominal pain affecting pregnancy 06/14/2015  . Indication for care in labor and delivery, antepartum 05/27/2015    Past Surgical History:  Procedure Laterality Date  . CESAREAN SECTION N/A 07/17/2015   Procedure: CESAREAN SECTION;  Surgeon: Suzy Bouchard, MD;  Location: [facility] ORS;  Service: Obstetrics;  Laterality: N/A;  . WISDOM TOOTH EXTRACTION      Prior to Admission medications   Medication Sig Start Date End Date Taking? Authorizing Provider  albuterol (PROVENTIL HFA;VENTOLIN HFA) 108 (90 Base) MCG/ACT inhaler Inhale 2 puffs into the lungs every 6 (six) hours as needed for wheezing or shortness of breath. 02/17/16   Beers, Charmayne Sheer, PA-C  albuterol-ipratropium (COMBIVENT) 18-103 MCG/ACT inhaler Inhale 1 puff into the lungs every 6 (six) hours as needed for wheezing or shortness of breath. 03/01/16   Beers, Charmayne Sheer, PA-C  ketorolac (ACULAR) 0.5 % ophthalmic solution Place 1 drop into the left eye 4 (four) times daily. 07/19/17   Cuthriell, Delorise Royals, PA-C  meloxicam (MOBIC) 15 MG tablet Take 1 tablet (15 mg total) by mouth daily. 07/23/16   Cuthriell, Delorise Royals, PA-C  sulfamethoxazole-trimethoprim (BACTRIM DS,SEPTRA DS) 800-160 MG tablet Take 1 tablet by mouth 2 (two) times daily. 06/24/16   Cuthriell, Delorise Royals, PA-C  triamcinolone cream (KENALOG) 0.1 % Apply 1 application topically 4 (four) times daily. 06/24/16   Cuthriell, Delorise Royals, PA-C  ranitidine (ZANTAC) 150 MG tablet Take 150 mg by mouth 2 (two) times daily.  03/01/16  [provider]    Allergies Patient has no known allergies.  Family History  Problem Relation Age of Onset  . Hypertension Maternal Grandfather     Social History Social History   Tobacco Use  . Smoking status: Never Smoker  . Smokeless tobacco: Never Used  Substance Use Topics  . Alcohol use: No  . Drug use: No    Review of Systems  Constitutional: No fever/chills Eyes: No visual changes. ENT: No sore throat. Cardiovascular:chest pain. Respiratory: Denies shortness of breath. Gastrointestinal: No abdominal pain.  No nausea, no vomiting.  No diarrhea.  No constipation. Genitourinary:  Negative for dysuria. Musculoskeletal: Negative for back pain. Skin: Negative for rash. Neurological: Negative for headaches, focal weakness  ____________________________________________   PHYSICAL EXAM:  VITAL SIGNS: ED Triage Vitals  Enc Vitals Group     BP 08/26/18 1941 133/88     Pulse Rate 08/26/18 1941 98     Resp 08/26/18 1941 18     Temp 08/26/18 1941 98.5 F (36.9 C)     Temp Source 08/26/18 1941 Oral     SpO2 08/26/18 1941 97 %     Weight 08/26/18 1940 215 lb (97.5  kg)     Height 08/26/18 1940 5\' 4"  (1.626 m)     Head Circumference --      Peak Flow --      Pain Score 08/26/18 1940 4     Pain Loc --      Pain Edu? --      Excl. in GC? --     Constitutional: Alert and oriented. Well appearing and in no acute distress. Eyes: Conjunctivae are normal.  Head: Atraumatic. Nose: No congestion/rhinnorhea. Mouth/Throat: Mucous membranes are moist.  Oropharynx non-erythematous. Neck: No stridor. Cardiovascular: Normal rate, regular rhythm. Grossly normal heart sounds.  Good peripheral circulation. Respiratory: Normal respiratory effort.  No retractions. Lungs CTAB. Gastrointestinal: Soft and nontender except for some pain in the epigastric area.. No distention. No abdominal bruits. No CVA tenderness. Musculoskeletal: No lower extremity tenderness nor edema.  No joint effusions. Neurologic:  Normal speech and language. No gross focal neurologic deficits are appreciated. No gait instability. Skin:  Skin is warm, dry and intact. No rash noted. Psychiatric: Mood and affect are normal. Speech and behavior are normal.  ____________________________________________   LABS (all labs ordered are listed, but only abnormal results are displayed)  Labs Reviewed  CBC - Abnormal; Notable for the following components:      Result Value   WBC 12.4 (*)    All other components within normal limits  COMPREHENSIVE METABOLIC PANEL - Abnormal; Notable for the following components:   Calcium 8.5 (*)    All other components within normal limits  TROPONIN I   ____________________________________________  EKG  EKG read and interpreted by me shows normal sinus rhythm rate of 98 normal axis no acute ST-T wave changes ____________________________________________  RADIOLOGY  ED MD interpretation: X-ray read by radiology reviewed by me shows no acute disease  Official radiology report(s): Dg Chest 2 View  Result Date: 08/26/2018 CLINICAL DATA:  Sternal chest pain  EXAM: CHEST - 2 VIEW COMPARISON:  03/01/2016 FINDINGS: The lungs are clear without focal pneumonia, edema, pneumothorax or pleural effusion. The cardiopericardial silhouette is within normal limits for size. The visualized bony structures of the thorax are intact. IMPRESSION: No active cardiopulmonary disease. Electronically Signed   By: Kennith Center M.D.   On: 08/26/2018 21:48    ____________________________________________   PROCEDURES  Procedure(s) performed:   Procedures  Critical Care performed:   ____________________________________________   INITIAL IMPRESSION / ASSESSMENT AND PLAN / ED COURSE  Patient's pain is much better with GI cocktail.  We will give her Carafate liquid for a few days and then proton pump inhibitor.  She will follow-up with her doctor.  Return if worse.         ____________________________________________   FINAL CLINICAL IMPRESSION(S) / ED DIAGNOSES  Final diagnoses:  None     ED Discharge Orders    None       Note:  This document was prepared using Dragon voice  recognition software and may include unintentional dictation errors.    Arnaldo Natal, MD 08/26/18 [phone]

## 2018-08-26 NOTE — ED Triage Notes (Signed)
Pt in with co mid sternal chest pain since Wednesday. Pt states pain worse when she takes a deep breath, swallows, and eats. Pt is currently being treated for throat infection, was taking amoxicillin and prednisone.

## 2018-08-26 NOTE — Discharge Instructions (Signed)
Take the Carafate 10 cc or 2 teaspoons 4 times a day.  Take the Nexium 1 pill once a day.  Be careful not to take the Carafate with any other medication because it will act like a sponge and sop it up and not let it work.  Take the Carafate 2 hours before after any other medicine.  Please return here if you are worse or not any better few days follow-up with your regular doctor later this week unless you are well.

## 2019-06-10 ENCOUNTER — Emergency Department: Payer: 59

## 2019-06-10 ENCOUNTER — Other Ambulatory Visit: Payer: Self-pay

## 2019-06-10 ENCOUNTER — Emergency Department
Admission: EM | Admit: 2019-06-10 | Discharge: 2019-06-10 | Disposition: A | Discharge status: Home / Self Care | Payer: 59 | Attending: Emergency Medicine | Admitting: Emergency Medicine

## 2019-06-10 DIAGNOSIS — Z79899 Other long term (current) drug therapy: Secondary | ICD-10-CM | POA: Insufficient documentation

## 2019-06-10 DIAGNOSIS — J45909 Unspecified asthma, uncomplicated: Secondary | ICD-10-CM | POA: Insufficient documentation

## 2019-06-10 DIAGNOSIS — M79671 Pain in right foot: Secondary | ICD-10-CM | POA: Diagnosis present

## 2019-06-10 MED ORDER — IBUPROFEN 600 MG PO TABS: 600.0000 mg | ORAL_TABLET | Freq: Four times a day (QID) | ORAL | 0 refills | Status: DC | PRN

## 2019-06-10 MED ORDER — KETOROLAC TROMETHAMINE 30 MG/ML IJ SOLN
30.0000 mg | Freq: Once | INTRAMUSCULAR | Status: AC
  Administered 2019-06-10: 12:00:00 30 mg via INTRAMUSCULAR
  Filled 2019-06-10: qty 1

## 2019-06-10 NOTE — ED Triage Notes (Signed)
Right foot pain intermittently X months, worse after exertion. Pt alert and oriented X4, active, cooperative, pt in NAD. RR even and unlabored, color WNL.

## 2019-06-10 NOTE — ED Notes (Signed)
See triage note  Presents with pain to right foot  Pain is mainly to heel area

## 2019-06-10 NOTE — ED Provider Notes (Signed)
[facility] Emergency Department Provider Note  ____________________________________________  Time seen: Approximately 11:33 AM  I have reviewed the triage vital signs and the nursing notes.   HISTORY  Chief Complaint Foot Pain    HPI Kellie Mejia is a 28 y.o. female that presents to the emergency department for evaluation of right foot pain on and off for 1 year.  Patient states that pain is to the inside of her foot, heel of her foot and behind the heel of her foot.  Patient plays softball and is on her feet all day for work.  No specific injury.   No fever.    Past Medical History:  Diagnosis Date  . Anxiety   . Asthma    excercise induced  . Depression   . GERD (gastroesophageal reflux disease)     Patient Active Problem List   Diagnosis Date Noted  . Postoperative state 07/17/2015  . Labor and delivery, indication for care 07/16/2015  . Pregnancy 07/10/2015  . Abdominal pain 07/01/2015  . Abdominal pain affecting pregnancy 06/14/2015  . Indication for care in labor and delivery, antepartum 05/27/2015    Past Surgical History:  Procedure Laterality Date  . CESAREAN SECTION N/A 07/17/2015   Procedure: CESAREAN SECTION;  Surgeon: Boykin Nearing, MD;  Location: [facility] ORS;  Service: Obstetrics;  Laterality: N/A;  . WISDOM TOOTH EXTRACTION      Prior to Admission medications   Medication Sig Start Date End Date Taking? Authorizing Provider  albuterol (PROVENTIL HFA;VENTOLIN HFA) 108 (90 Base) MCG/ACT inhaler Inhale 2 puffs into the lungs every 6 (six) hours as needed for wheezing or shortness of breath. 02/17/16   Beers, Pierce Crane, PA-C  albuterol-ipratropium (COMBIVENT) 18-103 MCG/ACT inhaler Inhale 1 puff into the lungs every 6 (six) hours as needed for wheezing or shortness of breath. 03/01/16   Beers, Pierce Crane, PA-C  esomeprazole (NEXIUM) 40 MG capsule Take 1 capsule (40 mg total) by mouth daily. 08/26/18 08/26/19  Nena Polio, MD  ibuprofen (ADVIL) 600 MG tablet Take 1 tablet (600 mg total) by mouth every 6 (six) hours as needed. 06/10/19   Laban Emperor, PA-C  triamcinolone cream (KENALOG) 0.1 % Apply 1 application topically 4 (four) times daily. 06/24/16   Cuthriell, Charline Bills, PA-C    Allergies Patient has no known allergies.  Family History  Problem Relation Age of Onset  . Hypertension Maternal Grandfather     Social History Social History   Tobacco Use  . Smoking status: Never Smoker  . Smokeless tobacco: Never Used  Substance Use Topics  . Alcohol use: No  . Drug use: No     Review of Systems  Constitutional: No fever/chills Respiratory: No SOB. Gastrointestinal: No nausea, no vomiting.  Musculoskeletal: Positive for foot pain. Skin: Negative for rash, abrasions, lacerations, ecchymosis. Neurological: Negative for numbness or tingling   ____________________________________________   PHYSICAL EXAM:  VITAL SIGNS: ED Triage Vitals  Enc Vitals Group     BP 06/10/19 1005 129/87     Pulse Rate 06/10/19 1005 81     Resp 06/10/19 1005 20     Temp 06/10/19 1005 98.6 F (37 C)     Temp Source 06/10/19 1005 Oral     SpO2 06/10/19 1005 98 %     Weight 06/10/19 1006 210 lb (95.3 kg)     Height 06/10/19 1006 5\' 4"  (1.626 m)     Head Circumference --      Peak Flow --  Pain Score 06/10/19 1008 5     Pain Loc --      Pain Edu? --      Excl. in GC? --      Constitutional: Alert and oriented. Well appearing and in no acute distress. Eyes: Conjunctivae are normal. PERRL. EOMI. Head: Atraumatic. ENT:      Ears:      Nose: No congestion/rhinnorhea.      Mouth/Throat: Mucous membranes are moist.  Neck: No stridor.  Cardiovascular: Normal rate, regular rhythm.  Good peripheral circulation. Respiratory: Normal respiratory effort without tachypnea or retractions. Lungs CTAB. Good air entry to the bases with no decreased or absent breath sounds. Musculoskeletal: Full range of motion  to all extremities. No gross deformities appreciated.  Tenderness to palpation to medial right foot inferior to medial malleolus.  Mild tenderness to palpation to posterior heel and dorsal heel.  No erythema or ecchymosis.  Full range of motion of ankle, foot, toes. Neurologic:  Normal speech and language. No gross focal neurologic deficits are appreciated.  Skin:  Skin is warm, dry and intact. No rash noted. Psychiatric: Mood and affect are normal. Speech and behavior are normal. Patient exhibits appropriate insight and judgement.   ____________________________________________   LABS (all labs ordered are listed, but only abnormal results are displayed)  Labs Reviewed - No data to display ____________________________________________  EKG   ____________________________________________  RADIOLOGY Lexine BatonI, Rossi Burdo, personally viewed and evaluated these images (plain radiographs) as part of my medical decision making, as well as reviewing the written report by the radiologist.  Dg Foot Complete Right  Result Date: 06/10/2019 CLINICAL DATA:  Right foot pain for months. EXAM: RIGHT FOOT COMPLETE - 3+ VIEW COMPARISON:  None. FINDINGS: There is no evidence of fracture or dislocation. There is no evidence of arthropathy or other focal bone abnormality. Soft tissues are unremarkable. IMPRESSION: Negative. Electronically Signed   By: Gerome Samavid  Williams III M.D   On: 06/10/2019 11:12    ____________________________________________    PROCEDURES  Procedure(s) performed:    Procedures    Medications  ketorolac (TORADOL) 30 MG/ML injection 30 mg (30 mg Intramuscular Given 06/10/19 1139)     ____________________________________________   INITIAL IMPRESSION / ASSESSMENT AND PLAN / ED COURSE  Pertinent labs & imaging results that were available during my care of the patient were reviewed by me and considered in my medical decision making (see chart for details).  Review of the [place] CSRS  was performed in accordance of the [facility] prior to dispensing any controlled drugs.   Patient presented emergency department for evaluation of intermittent foot pain for 1 year.  Vital signs and exam are reassuring.  X-ray negative for bony abnormality.  Symptoms are likely inflammatory.  IM Toradol was given for pain and inflammation.  Crutches were given.  Patient declines postop shoe.  Patient will be discharged home with prescriptions for Motrin. Patient is to follow up with podiatry as directed.  Referral was given.  Patient is given ED precautions to return to the ED for any worsening or new symptoms.   Kellie Mejia was evaluated in Emergency Department on 06/10/2019 for the symptoms described in the history of present illness. She was evaluated in the context of the global COVID-19 pandemic, which necessitated consideration that the patient might be at risk for infection with the SARS-CoV-2 virus that causes COVID-19. Institutional protocols and algorithms that pertain to the evaluation of patients at risk for COVID-19 are in a state of rapid change  based on information released by regulatory bodies including the CDC and federal and state organizations. These policies and algorithms were followed during the patient's care in the ED.  ____________________________________________  FINAL CLINICAL IMPRESSION(S) / ED DIAGNOSES  Final diagnoses:  Foot pain, right      NEW MEDICATIONS STARTED DURING THIS VISIT:  ED Discharge Orders         Ordered    ibuprofen (ADVIL) 600 MG tablet  Every 6 hours PRN     06/10/19 1145              This chart was dictated using voice recognition software/Dragon. Despite best efforts to proofread, errors can occur which can change the meaning. Any change was purely unintentional.    Enid DerryWagner, Cong Hightower, PA-C 06/10/19 1738    Jene EveryKinner, Robert, MD 06/12/19 1944

## 2019-09-27 ENCOUNTER — Other Ambulatory Visit: Payer: Self-pay

## 2019-09-27 ENCOUNTER — Ambulatory Visit (LOCAL_COMMUNITY_HEALTH_CENTER): Payer: Self-pay

## 2019-09-27 VITALS — BP 118/79 | Ht 64.5 in | Wt 204.5 lb

## 2019-09-27 DIAGNOSIS — Z3201 Encounter for pregnancy test, result positive: Secondary | ICD-10-CM

## 2019-09-27 MED ORDER — PRENATAL VITAMIN 27-0.8 MG PO TABS: 1.0000 | ORAL_TABLET | Freq: Every day | ORAL | 0 refills | Status: AC

## 2019-09-27 NOTE — Progress Notes (Signed)
Pt states she is considering [facility] for prenatal care; declines preadmit.

## 2019-09-30 LAB — PREGNANCY, URINE: Preg Test, Ur: POSITIVE — AB

## 2019-10-01 ENCOUNTER — Emergency Department: Payer: 59

## 2019-10-01 ENCOUNTER — Encounter: Payer: Self-pay | Admitting: Emergency Medicine

## 2019-10-01 ENCOUNTER — Other Ambulatory Visit: Payer: Self-pay

## 2019-10-01 ENCOUNTER — Emergency Department
Admission: EM | Admit: 2019-10-01 | Discharge: 2019-10-01 | Disposition: A | Discharge status: Home / Self Care | Payer: 59 | Attending: Student in an Organized Health Care Education/Training Program | Admitting: Student in an Organized Health Care Education/Training Program

## 2019-10-01 DIAGNOSIS — R1031 Right lower quadrant pain: Secondary | ICD-10-CM | POA: Diagnosis not present

## 2019-10-01 DIAGNOSIS — O26891 Other specified pregnancy related conditions, first trimester: Secondary | ICD-10-CM | POA: Diagnosis present

## 2019-10-01 DIAGNOSIS — Z3A01 Less than 8 weeks gestation of pregnancy: Secondary | ICD-10-CM | POA: Diagnosis not present

## 2019-10-01 LAB — COMPREHENSIVE METABOLIC PANEL
ALT: 15 U/L (ref 0–44)
AST: 15 U/L (ref 15–41)
Albumin: 4.1 g/dL (ref 3.5–5.0)
Alkaline Phosphatase: 51 U/L (ref 38–126)
Anion gap: 9 (ref 5–15)
BUN: 6 mg/dL (ref 6–20)
CO2: 22 mmol/L (ref 22–32)
Calcium: 8.9 mg/dL (ref 8.9–10.3)
Chloride: 107 mmol/L (ref 98–111)
Creatinine, Ser: 0.81 mg/dL (ref 0.44–1.00)
GFR calc Af Amer: >60 mL/min (ref >60)
GFR calc non Af Amer: >60 mL/min (ref >60)
Glucose, Bld: 98 mg/dL (ref 70–99)
Potassium: 3.8 mmol/L (ref 3.5–5.1)
Sodium: 138 mmol/L (ref 135–145)
Total Bilirubin: 0.6 mg/dL (ref 0.3–1.2)
Total Protein: 7.4 g/dL (ref 6.5–8.1)

## 2019-10-01 LAB — CBC WITH DIFFERENTIAL/PLATELET
Abs Immature Granulocytes: 0.03 10*3/uL (ref 0.00–0.07)
Basophils Absolute: 0.1 10*3/uL (ref 0.0–0.1)
Basophils Relative: 1 %
Eosinophils Absolute: 0.1 10*3/uL (ref 0.0–0.5)
Eosinophils Relative: 2 %
HCT: 43.2 % (ref 36.0–46.0)
Hemoglobin: 14.7 g/dL (ref 12.0–15.0)
Immature Granulocytes: 0 %
Lymphocytes Relative: 36 %
Lymphs Abs: 3.1 10*3/uL (ref 0.7–4.0)
MCH: 29.6 pg (ref 26.0–34.0)
MCHC: 34 g/dL (ref 30.0–36.0)
MCV: 87.1 fL (ref 80.0–100.0)
Monocytes Absolute: 0.5 10*3/uL (ref 0.1–1.0)
Monocytes Relative: 6 %
Neutro Abs: 4.7 10*3/uL (ref 1.7–7.7)
Neutrophils Relative %: 55 %
Platelets: 334 10*3/uL (ref 150–400)
RBC: 4.96 MIL/uL (ref 3.87–5.11)
RDW: 12.7 % (ref 11.5–15.5)
WBC: 8.5 10*3/uL (ref 4.0–10.5)
nRBC: 0 % (ref 0.0–0.2)

## 2019-10-01 LAB — URINALYSIS, COMPLETE (UACMP) WITH MICROSCOPIC
Bacteria, UA: NONE SEEN
Bilirubin Urine: NEGATIVE
Glucose, UA: NEGATIVE mg/dL
Ketones, ur: 5 mg/dL — AB
Leukocytes,Ua: NEGATIVE
Nitrite: NEGATIVE
Protein, ur: 30 mg/dL — AB
Specific Gravity, Urine: 1.024 (ref 1.005–1.030)
pH: 5 (ref 5.0–8.0)

## 2019-10-01 LAB — HCG, QUANTITATIVE, PREGNANCY: hCG, Beta Chain, Quant, S: 2671 m[IU]/mL — ABNORMAL HIGH (ref <5)

## 2019-10-01 MED ORDER — ONDANSETRON HCL 4 MG/2ML IJ SOLN
4.0000 mg | Freq: Once | INTRAMUSCULAR | Status: AC
  Administered 2019-10-01: 4 mg via INTRAVENOUS
  Filled 2019-10-01: qty 2

## 2019-10-01 MED ORDER — SODIUM CHLORIDE 0.9 % IV BOLUS
1000.0000 mL | Freq: Once | INTRAVENOUS | Status: AC
  Administered 2019-10-01: 1000 mL via INTRAVENOUS

## 2019-10-01 MED ORDER — ONDANSETRON HCL 4 MG PO TABS: 4.0000 mg | ORAL_TABLET | Freq: Every day | ORAL | 0 refills | Status: DC | PRN

## 2019-10-01 MED ORDER — ACETAMINOPHEN 500 MG PO TABS
1000.0000 mg | ORAL_TABLET | Freq: Once | ORAL | Status: AC
  Administered 2019-10-01: 1000 mg via ORAL
  Filled 2019-10-01: qty 2

## 2019-10-01 NOTE — ED Triage Notes (Signed)
PT form [facility], with c/o RLQ abd pain since yesterday . PT is 4wks preg. Denies any vaginal bleeding. Pt c/o nausea. Denies any fever

## 2019-10-01 NOTE — ED Notes (Signed)
Pt verbalizes understanding of d/c instructions, medications and follow up 

## 2019-10-01 NOTE — ED Provider Notes (Signed)
[facility] Emergency Department Provider Note  ____________________________________________   None    (approximate)   I have reviewed the triage vital signs and the nursing notes.   Patient has been triaged with a MSE exam performed by myself at a minimum. Based on symptoms and screening exam, patient may receive a more in-depth exam, labs, imaging as detailed below. Patients have been advised of this setting and exam type at the time of patient interview.    HISTORY  Chief Complaint Abdominal Pain    HPI Kellie Mejia is a 28 y.o. female presents to the emergency department with a complaint of rlql pain, approx 4 weeks preg, concerned about ectopic and appendicitis.  Blood type is . A+ per patient  Patient will receive a medical screening exam as detailed below.  Based off of this exam, more in depth exam, labs, imaging will be performed as needed for complaint.  Patient care will be eventually transferred to another provider in the emergency department for final exam, diagnosis and disposition.    Past Medical History:  Diagnosis Date  . Anxiety   . Asthma    excercise induced  . Depression   . GERD (gastroesophageal reflux disease)     Patient Active Problem List   Diagnosis Date Noted  . Postoperative state 07/17/2015  . Labor and delivery, indication for care 07/16/2015  . Pregnancy 07/10/2015  . Abdominal pain 07/01/2015  . Abdominal pain affecting pregnancy 06/14/2015  . Indication for care in labor and delivery, antepartum 05/27/2015    Past Surgical History:  Procedure Laterality Date  . CESAREAN SECTION N/A 07/17/2015   Procedure: CESAREAN SECTION;  Surgeon: Boykin Nearing, MD;  Location: [facility] ORS;  Service: Obstetrics;  Laterality: N/A;  . WISDOM TOOTH EXTRACTION      Prior to Admission medications   Medication Sig Start Date End Date Taking? Authorizing Provider  albuterol (PROVENTIL HFA;VENTOLIN HFA) 108  (90 Base) MCG/ACT inhaler Inhale 2 puffs into the lungs every 6 (six) hours as needed for wheezing or shortness of breath. Patient not taking: Reported on 09/27/2019 02/17/16   Arlyss Repress, PA-C  albuterol-ipratropium (COMBIVENT) 18-103 MCG/ACT inhaler Inhale 1 puff into the lungs every 6 (six) hours as needed for wheezing or shortness of breath. Patient not taking: Reported on 09/27/2019 03/01/16   Beers, Pierce Crane, PA-C  esomeprazole (NEXIUM) 40 MG capsule Take 1 capsule (40 mg total) by mouth daily. 08/26/18 08/26/19  Nena Polio, MD  ibuprofen (ADVIL) 600 MG tablet Take 1 tablet (600 mg total) by mouth every 6 (six) hours as needed. Patient not taking: Reported on 09/27/2019 06/10/19   Laban Emperor, PA-C  Prenatal Vit-Fe Fumarate-FA (PRENATAL VITAMIN) 27-0.8 MG TABS Take 1 tablet by mouth daily. 09/27/19 01/05/20  Caren Macadam, MD  triamcinolone cream (KENALOG) 0.1 % Apply 1 application topically 4 (four) times daily. Patient not taking: Reported on 09/27/2019 06/24/16   Cuthriell, Charline Bills, PA-C    Allergies Patient has no known allergies.  Family History  Problem Relation Age of Onset  . Hypertension Maternal Grandfather     Social History Social History   Tobacco Use  . Smoking status: Never Smoker  . Smokeless tobacco: Never Used  Substance Use Topics  . Alcohol use: Not Currently  . Drug use: Not Currently    Types: Marijuana    Review of Systems Constitutional: mild fever ENT: no nasal congestion/rhinorhea. no sore throat Cardiovascular: no chest pain. Respiratory: no cough.  no shortness of breath/difficulty breathing Gastroenterology: yes, rlq abdominal pain Musculoskeletal: no for musculoskeletal pain Integumentary: Negative for rash. Neurological: No focal weakness nor numbness.   ____________________________________________   PHYSICAL EXAM:  VITAL SIGNS: ED Triage Vitals  Enc Vitals Group     BP 10/01/19 1233 124/79     Pulse Rate 10/01/19  1233 71     Resp 10/01/19 1233 18     Temp 10/01/19 1233 98.6 F (37 C)     Temp Source 10/01/19 1233 Oral     SpO2 10/01/19 1233 98 %     Weight 10/01/19 1234 206 lb (93.4 kg)     Height 10/01/19 1234 5\' 4"  (1.626 m)     Head Circumference --      Peak Flow --      Pain Score --      Pain Loc --      Pain Edu? --      Excl. in GC? --     Constitutional: Alert and oriented. Generally well appearing and in no acute distress. Eyes: Conjunctivae are normal.  Nose: No significant congestion/rhinnorhea. Mouth: No gross oropharyngeal edema.  Cardiovascular: Grossly normal heart sounds. Respiratory: Normal respiratory effort without significant tachypnea and no observed retractions. Lungs c t a Gastrointestinal: No significant visible abdominal wall findings.  Bowel sounds x4 quadrants. tender to palpation.in rlq Musculoskeletal: No gross deformities of extremities. Neurologic:  Normal speech and language. No gross focal neurologic deficits are appreciated.  Skin:  Skin is warm, dry and intact. No rash noted.    ____________________________________________   LABS (all labs ordered are listed, but only abnormal results are displayed)  Labs Reviewed  CBC WITH DIFFERENTIAL/PLATELET  COMPREHENSIVE METABOLIC PANEL  HCG, QUANTITATIVE, PREGNANCY  URINALYSIS, COMPLETE (UACMP) WITH MICROSCOPIC  POC URINE PREG, ED    ____________________________________________   RADIOLOGY   Official radiology report(s): No results found.  ____________________________________________    INITIAL IMPRESSION / MDM / ASSESSMENT AND PLAN / ED COURSE  As part of my medical decision making, I reviewed the following data within the electronic MEDICAL RECORD NUMBER Nursing notes reviewed and incorporated, Notes from prior ED visits and [place] Controlled Substance Database      Clinical Impression: rlq pain   Plan: r/o ectopic, if neg consider appendicitis  Patient has been screened based based on their  arrival complaint, evaluated for an emergent condition, and at a minimum has received a medical screening exam.  At this time, patient will receive further work-up as determined by medical screening exam.  Patient care will eventually be transferred to another provider in the emergency department for final diagnosis and disposition.    ____________________________________________  Note:  This document was prepared using and may include unintentional dictation errors.    Conservation officer, historic buildings, PA-C 10/01/19 1246    13/10/20, MD 10/01/19 1309

## 2019-10-01 NOTE — ED Triage Notes (Signed)
First Nurse Note:  Arrives from [place] for evaluation of RLQ pain.  Patient is approximately [redacted] weeks pregnant.  AAOx3.  Skin warm and dry. NAD.  Posture upright and relaxed.  Gait Steady.

## 2019-10-01 NOTE — ED Provider Notes (Signed)
[facility] Emergency Department Provider Note    First MD Initiated Contact with Patient 10/01/19 1349     (approximate)  I have reviewed the triage vital signs and the nursing notes.   HISTORY  Chief Complaint Abdominal Pain    HPI Kellie Mejia is a 28 y.o. female presents to the ER for  evaluation of right lower quadrant pain is progressive worsening the past day.  Denies any fevers.  States that she is roughly [redacted] weeks pregnant.  Has never had pain like this before.  No history of tubal pregnancy.  Is does have a history of C-section.  Not have any vaginal bleeding.  States that she has been having issues with morning sickness.   Past Medical History:  Diagnosis Date  . Anxiety   . Asthma    excercise induced  . Depression   . GERD (gastroesophageal reflux disease)    Family History  Problem Relation Age of Onset  . Hypertension Maternal Grandfather    Past Surgical History:  Procedure Laterality Date  . CESAREAN SECTION N/A 07/17/2015   Procedure: CESAREAN SECTION;  Surgeon: Suzy Bouchardhomas J Schermerhorn, MD;  Location: [facility] ORS;  Service: Obstetrics;  Laterality: N/A;  . WISDOM TOOTH EXTRACTION     Patient Active Problem List   Diagnosis Date Noted  . Postoperative state 07/17/2015  . Labor and delivery, indication for care 07/16/2015  . Pregnancy 07/10/2015  . Abdominal pain 07/01/2015  . Abdominal pain affecting pregnancy 06/14/2015  . Indication for care in labor and delivery, antepartum 05/27/2015      Prior to Admission medications   Medication Sig Start Date End Date Taking? Authorizing Provider  albuterol (PROVENTIL HFA;VENTOLIN HFA) 108 (90 Base) MCG/ACT inhaler Inhale 2 puffs into the lungs every 6 (six) hours as needed for wheezing or shortness of breath. Patient not taking: Reported on 09/27/2019 02/17/16   Evangeline DakinBeers, Charles M, PA-C  albuterol-ipratropium (COMBIVENT) 18-103 MCG/ACT inhaler Inhale 1 puff into the lungs every  6 (six) hours as needed for wheezing or shortness of breath. Patient not taking: Reported on 09/27/2019 03/01/16   Beers, Charmayne Sheerharles M, PA-C  esomeprazole (NEXIUM) 40 MG capsule Take 1 capsule (40 mg total) by mouth daily. 08/26/18 08/26/19  Arnaldo NatalMalinda, Paul F, MD  ibuprofen (ADVIL) 600 MG tablet Take 1 tablet (600 mg total) by mouth every 6 (six) hours as needed. Patient not taking: Reported on 09/27/2019 06/10/19   Enid DerryWagner, Ashley, PA-C  ondansetron (ZOFRAN) 4 MG tablet Take 1 tablet (4 mg total) by mouth daily as needed. 10/01/19 09/30/20  Willy Eddyobinson, Mckensie Scotti, MD  Prenatal Vit-Fe Fumarate-FA (PRENATAL VITAMIN) 27-0.8 MG TABS Take 1 tablet by mouth daily. 09/27/19 01/05/20  Federico FlakeNewton, Kimberly Niles, MD  triamcinolone cream (KENALOG) 0.1 % Apply 1 application topically 4 (four) times daily. Patient not taking: Reported on 09/27/2019 06/24/16   Cuthriell, Delorise RoyalsJonathan D, PA-C    Allergies Patient has no known allergies.    Social History Social History   Tobacco Use  . Smoking status: Never Smoker  . Smokeless tobacco: Never Used  Substance Use Topics  . Alcohol use: Not Currently  . Drug use: Not Currently    Types: Marijuana    Review of Systems Patient denies headaches, rhinorrhea, blurry vision, numbness, shortness of breath, chest pain, edema, cough, abdominal pain, nausea, vomiting, diarrhea, dysuria, fevers, rashes or hallucinations unless otherwise stated above in HPI. ____________________________________________   PHYSICAL EXAM:  VITAL SIGNS: Vitals:   10/01/19 1233  BP: 124/79  Pulse: 71  Resp: 18  Temp: 98.6 F (37 C)  SpO2: 98%    Constitutional: Alert and oriented.  Eyes: Conjunctivae are normal.  Head: Atraumatic. Nose: No congestion/rhinnorhea. Mouth/Throat: Mucous membranes are moist.   Neck: No stridor. Painless ROM.  Cardiovascular: Normal rate, regular rhythm. Grossly normal heart sounds.  Good peripheral circulation. Respiratory: Normal respiratory effort.  No  retractions. Lungs CTAB. Gastrointestinal: Soft and with minimal ttp in RLQ, no rebound or guarding. No distention. No abdominal bruits. No CVA tenderness. Genitourinary:  Musculoskeletal: No lower extremity tenderness nor edema.  No joint effusions. Neurologic:  Normal speech and language. No gross focal neurologic deficits are appreciated. No facial droop Skin:  Skin is warm, dry and intact. No rash noted. Psychiatric: Mood and affect are normal. Speech and behavior are normal.  ____________________________________________   LABS (all labs ordered are listed, but only abnormal results are displayed)  Results for orders placed or performed during the hospital encounter of 10/01/19 (from the past 24 hour(s))  CBC with Differential     Status: None   Collection Time: 10/01/19 12:41 PM  Result Value Ref Range   WBC 8.5 4.0 - 10.5 K/uL   RBC 4.96 3.87 - 5.11 MIL/uL   Hemoglobin 14.7 12.0 - 15.0 g/dL   HCT 43.2 36.0 - 46.0 %   MCV 87.1 80.0 - 100.0 fL   MCH 29.6 26.0 - 34.0 pg   MCHC 34.0 30.0 - 36.0 g/dL   RDW 12.7 11.5 - 15.5 %   Platelets 334 150 - 400 K/uL   nRBC 0.0 0.0 - 0.2 %   Neutrophils Relative % 55 %   Neutro Abs 4.7 1.7 - 7.7 K/uL   Lymphocytes Relative 36 %   Lymphs Abs 3.1 0.7 - 4.0 K/uL   Monocytes Relative 6 %   Monocytes Absolute 0.5 0.1 - 1.0 K/uL   Eosinophils Relative 2 %   Eosinophils Absolute 0.1 0.0 - 0.5 K/uL   Basophils Relative 1 %   Basophils Absolute 0.1 0.0 - 0.1 K/uL   Immature Granulocytes 0 %   Abs Immature Granulocytes 0.03 0.00 - 0.07 K/uL  Comprehensive metabolic panel     Status: None   Collection Time: 10/01/19 12:41 PM  Result Value Ref Range   Sodium 138 135 - 145 mmol/L   Potassium 3.8 3.5 - 5.1 mmol/L   Chloride 107 98 - 111 mmol/L   CO2 22 22 - 32 mmol/L   Glucose, Bld 98 70 - 99 mg/dL   BUN 6 6 - 20 mg/dL   Creatinine, Ser 0.81 0.44 - 1.00 mg/dL   Calcium 8.9 8.9 - 10.3 mg/dL   Total Protein 7.4 6.5 - 8.1 g/dL   Albumin 4.1  3.5 - 5.0 g/dL   AST 15 15 - 41 U/L   ALT 15 0 - 44 U/L   Alkaline Phosphatase 51 38 - 126 U/L   Total Bilirubin 0.6 0.3 - 1.2 mg/dL   GFR calc non Af Amer >60 >60 mL/min   GFR calc Af Amer >60 >60 mL/min   Anion gap 9 5 - 15  hCG, quantitative, pregnancy     Status: Abnormal   Collection Time: 10/01/19 12:41 PM  Result Value Ref Range   hCG, Beta Chain, Quant, S 2,671 (H) <5 mIU/mL  Urinalysis, Complete w Microscopic     Status: Abnormal   Collection Time: 10/01/19  2:13 PM  Result Value Ref Range   Color, Urine YELLOW (A) YELLOW  APPearance HAZY (A) CLEAR   Specific Gravity, Urine 1.024 1.005 - 1.030   pH 5.0 5.0 - 8.0   Glucose, UA NEGATIVE NEGATIVE mg/dL   Hgb urine dipstick SMALL (A) NEGATIVE   Bilirubin Urine NEGATIVE NEGATIVE   Ketones, ur 5 (A) NEGATIVE mg/dL   Protein, ur 30 (A) NEGATIVE mg/dL   Nitrite NEGATIVE NEGATIVE   Leukocytes,Ua NEGATIVE NEGATIVE   RBC / HPF 6-10 0 - 5 RBC/hpf   WBC, UA 0-5 0 - 5 WBC/hpf   Bacteria, UA NONE SEEN NONE SEEN   Squamous Epithelial / LPF 6-10 0 - 5   Mucus PRESENT    ____________________________________________ ____________________________________________  RADIOLOGY  I personally reviewed all radiographic images ordered to evaluate for the above acute complaints and reviewed radiology reports and findings.  These findings were personally discussed with the patient.  Please see medical record for radiology report.  ____________________________________________   PROCEDURES  Procedure(s) performed:  Procedures    Critical Care performed: no ____________________________________________   INITIAL IMPRESSION / ASSESSMENT AND PLAN / ED COURSE  Pertinent labs & imaging results that were available during my care of the patient were reviewed by me and considered in my medical decision making (see chart for details).   DDX: Ectopic, pregnancy, UTI, cystitis, appendicitis, colitis, stone, suicidal strain  Schuyler Behan is a 28 y.o. who presents to the ED with symptoms as described above.  Patient afebrile well-appearing in no acute distress.  Able to ambulate with steady gait.  Certainly no rebound or guarding.  No peritoneal signs.  Do not feel that emergent CT imaging clinically indicated particular in the setting of her early pregnancy.  Denies discharge.  Will order ultrasound due to concern for possible ectopic or cyst.  Will give IVF and zofran.  Clinical Course as of Sep 30 1744  Tue Oct 01, 2019  1534 Ultrasound imaging is reassuring.  Does show probable early IUP.  No findings to suggest a ruptured ectopic at this time.  No evidence of ovarian torsion.  Given lack of fever or white count have a lower suspicion for appendicitis.  Will give additional IV fluids as she does feel better after receiving those and trial p.o.   [PR]  1652 Patient reassessed.  She is tolerating p.o.  Remains afebrile hemodynamically stable.  Discussed option for further observation in the ER and even an MRI to further evaluate and exclude appendicitis to have a low clinical suspicion at this time.  Patient feels comfortable with trial of outpatient observation and understands to return to the ER in 12 to 24 hours for repeat abdominal exam or sooner if symptoms worsen.  She will follow up [facility] for serial quant levels and reassessment.   [PR]    Clinical Course User Index [PR] Willy Eddy, MD    The patient was evaluated in Emergency Department today for the symptoms described in the history of present illness. He/she was evaluated in the context of the global COVID-19 pandemic, which necessitated consideration that the patient might be at risk for infection with the SARS-CoV-2 virus that causes COVID-19. Institutional protocols and algorithms that pertain to the evaluation of patients at risk for COVID-19 are in a state of rapid change based on information released by regulatory bodies including the CDC and  federal and state organizations. These policies and algorithms were followed during the patient's care in the ED.  As part of my medical decision making, I reviewed the following data within the electronic  MEDICAL RECORD NUMBER Nursing notes reviewed and incorporated, Labs reviewed, notes from prior ED visits and [place] Controlled Substance Database   ____________________________________________   FINAL CLINICAL IMPRESSION(S) / ED DIAGNOSES  Final diagnoses:  RLQ abdominal pain      NEW MEDICATIONS STARTED DURING THIS VISIT:  Discharge Medication List as of 10/01/2019  4:57 PM    START taking these medications   Details  ondansetron (ZOFRAN) 4 MG tablet Take 1 tablet (4 mg total) by mouth daily as needed., Starting Tue 10/01/2019, Until Wed 09/30/2020, Normal         Note:  This document was prepared using Dragon voice recognition software and may include unintentional dictation errors.    Willy Eddy, MD 10/01/19 1745

## 2019-10-01 NOTE — Discharge Instructions (Signed)

## 2019-10-01 NOTE — ED Notes (Signed)
Patient transported to ultrasound.

## 2019-10-02 ENCOUNTER — Telehealth: Payer: Self-pay | Admitting: Obstetrics and Gynecology

## 2019-10-02 NOTE — Telephone Encounter (Signed)
Patient confirmed Thursday appt

## 2019-10-02 NOTE — Telephone Encounter (Signed)
Called and left voicemail for patient to call back to confirm schedule appointment for Thursday, 10/03/19 at 9:20 with Dr. Gilman Schmidt

## 2019-10-02 NOTE — Telephone Encounter (Signed)
-----   Message from Homero Fellers, MD sent at 10/01/2019  6:03 PM EST ----- Kellie Mejia,  This patient needs to be added on to the schedule on Thursday please. GYN visit and need betahcg follow up for rule out ectopic.  Thank you,  Dr. Gilman Schmidt

## 2019-10-03 ENCOUNTER — Ambulatory Visit (INDEPENDENT_AMBULATORY_CARE_PROVIDER_SITE_OTHER): Payer: 59 | Admitting: Obstetrics and Gynecology

## 2019-10-03 ENCOUNTER — Encounter: Payer: Self-pay | Admitting: Obstetrics and Gynecology

## 2019-10-03 ENCOUNTER — Other Ambulatory Visit: Payer: Self-pay

## 2019-10-03 VITALS — BP 104/70 | Ht 64.0 in | Wt 205.0 lb

## 2019-10-03 DIAGNOSIS — R1031 Right lower quadrant pain: Secondary | ICD-10-CM

## 2019-10-03 DIAGNOSIS — Z3A01 Less than 8 weeks gestation of pregnancy: Secondary | ICD-10-CM

## 2019-10-03 NOTE — Progress Notes (Signed)
Patient ID: Kellie Mejia, female   DOB: [birth year], 28 y.o.   MRN: [id number]  Reason for Consult: Follow-up   Referred by No ref. provider found  Subjective:     HPI:  Kellie Mejia is a 28 y.o. female . She presents today as a follow up from the ER. Pain in RLQ, nausea vomiting started Monday.  Told possibly appendicitis but that she would need to follow up in ER for MRI if the pain worsens or continues.  She is taking zofran which is allowing her to keep food down. No bowel movement since Monday.  LMP 08/26/2019. She has been trying to conceive. Reports regular monthly menses. She denies hx of STDs. She is not a smoker.    Past Medical History:  Diagnosis Date  . Anxiety   . Asthma    excercise induced  . Depression   . GERD (gastroesophageal reflux disease)    Family History  Problem Relation Age of Onset  . Hypertension Maternal Grandfather    Past Surgical History:  Procedure Laterality Date  . CESAREAN SECTION N/A 07/17/2015   Procedure: CESAREAN SECTION;  Surgeon: Suzy Bouchard, MD;  Location: [facility] ORS;  Service: Obstetrics;  Laterality: N/A;  . WISDOM TOOTH EXTRACTION      Short Social History:  Social History   Tobacco Use  . Smoking status: Never Smoker  . Smokeless tobacco: Never Used  Substance Use Topics  . Alcohol use: Not Currently    No Known Allergies  Current Outpatient Medications  Medication Sig Dispense Refill  . ondansetron (ZOFRAN) 4 MG tablet Take 1 tablet (4 mg total) by mouth daily as needed. 10 tablet 0  . Prenatal Vit-Fe Fumarate-FA (PRENATAL VITAMIN) 27-0.8 MG TABS Take 1 tablet by mouth daily. 100 tablet 0  . albuterol (PROVENTIL HFA;VENTOLIN HFA) 108 (90 Base) MCG/ACT inhaler Inhale 2 puffs into the lungs every 6 (six) hours as needed for wheezing or shortness of breath. (Patient not taking: Reported on 09/27/2019) 1 Inhaler 2  . albuterol-ipratropium (COMBIVENT) 18-103 MCG/ACT inhaler Inhale 1 puff into  the lungs every 6 (six) hours as needed for wheezing or shortness of breath. (Patient not taking: Reported on 09/27/2019) 1 Inhaler 2  . esomeprazole (NEXIUM) 40 MG capsule Take 1 capsule (40 mg total) by mouth daily. 30 capsule 1  . ibuprofen (ADVIL) 600 MG tablet Take 1 tablet (600 mg total) by mouth every 6 (six) hours as needed. (Patient not taking: Reported on 09/27/2019) 30 tablet 0  . triamcinolone cream (KENALOG) 0.1 % Apply 1 application topically 4 (four) times daily. (Patient not taking: Reported on 09/27/2019) 30 g 3   No current facility-administered medications for this visit.     REVIEW OF SYSTEMS      Objective:  Objective   Vitals:   10/03/19 0932  BP: 104/70  Weight: 205 lb (93 kg)  Height: 5\' 4"  (1.626 m)   Body mass index is 35.19 kg/m.  Physical Exam Vitals signs and nursing note reviewed.  Constitutional:      Appearance: She is well-developed.  HENT:     Head: Normocephalic and atraumatic.  Eyes:     Pupils: Pupils are equal, round, and reactive to light.  Cardiovascular:     Rate and Rhythm: Normal rate and regular rhythm.  Pulmonary:     Effort: Pulmonary effort is normal. No respiratory distress.  Abdominal:     General: Abdomen is flat. There is no distension.     Palpations:  Abdomen is soft.     Tenderness: There is abdominal tenderness.     Comments: + RLQ tenderness  Skin:    General: Skin is warm and dry.  Neurological:     Mental Status: She is alert and oriented to person, place, and time.  Psychiatric:        Behavior: Behavior normal.        Thought Content: Thought content normal.        Judgment: Judgment normal.         Assessment/Plan:     28 yo with pregnancy of unknonw location, early gestional age 44 weeks 3 days.  Beta hcg today in office as 48 hour repeat Appears to be early gestational sac on transvaginal US.  Follow up in 1 week for [place] and betahcg. Discussed and urged ectopic/appendicitis precautions. Follow up  urgently in the ER if she experiences severe symptoms.   More than 20 minutes were spent face to face with the patient in the room with more than 50% of the time spent providing counseling and discussing the plan of management.    Adrian Prows MD [facility], [facility] 10/03/2019 10:14 AM

## 2019-10-04 ENCOUNTER — Other Ambulatory Visit: Payer: Self-pay | Admitting: Obstetrics and Gynecology

## 2019-10-04 ENCOUNTER — Telehealth: Payer: Self-pay | Admitting: Obstetrics and Gynecology

## 2019-10-04 DIAGNOSIS — O3680X0 Pregnancy with inconclusive fetal viability, not applicable or unspecified: Secondary | ICD-10-CM

## 2019-10-04 LAB — BETA HCG QUANT (REF LAB): hCG Quant: 3549 m[IU]/mL

## 2019-10-04 NOTE — Telephone Encounter (Signed)
Returned call and discussed result with patient- will come Monday for labs

## 2019-10-04 NOTE — Telephone Encounter (Signed)
Patient is schedule for 10/07/19 with LAB

## 2019-10-04 NOTE — Progress Notes (Signed)
Returned call and discussed result with patient- will come Monday for labs

## 2019-10-04 NOTE — Telephone Encounter (Signed)
-----   Message from Kellie Fellers, MD sent at 10/04/2019  3:22 PM EST ----- I asked this patient to come Monday for labs ( needs betahcg) -Dr. Gilman Schmidt

## 2019-10-04 NOTE — Telephone Encounter (Signed)
Patient is calling for labs results. Please advise. 

## 2019-10-07 ENCOUNTER — Other Ambulatory Visit: Payer: 59

## 2019-10-07 ENCOUNTER — Other Ambulatory Visit: Payer: Self-pay

## 2019-10-07 DIAGNOSIS — O3680X0 Pregnancy with inconclusive fetal viability, not applicable or unspecified: Secondary | ICD-10-CM

## 2019-10-08 LAB — BETA HCG QUANT (REF LAB): hCG Quant: 12070 m[IU]/mL

## 2019-10-09 NOTE — Progress Notes (Signed)
Rising appropriately, called and discussed with patient- she has been feeling well. No vaginal bleeding or abdominal pain. Some nausea. Follow up in 1 week with [place] in office.

## 2019-10-14 ENCOUNTER — Other Ambulatory Visit: Payer: Self-pay

## 2019-10-14 ENCOUNTER — Encounter: Payer: Self-pay | Admitting: Obstetrics and Gynecology

## 2019-10-14 ENCOUNTER — Ambulatory Visit (INDEPENDENT_AMBULATORY_CARE_PROVIDER_SITE_OTHER): Payer: 59 | Admitting: Obstetrics and Gynecology

## 2019-10-14 ENCOUNTER — Ambulatory Visit (INDEPENDENT_AMBULATORY_CARE_PROVIDER_SITE_OTHER): Payer: 59

## 2019-10-14 VITALS — BP 110/60 | Ht 64.0 in | Wt 208.0 lb

## 2019-10-14 DIAGNOSIS — N8311 Corpus luteum cyst of right ovary: Secondary | ICD-10-CM | POA: Diagnosis not present

## 2019-10-14 DIAGNOSIS — Z3A01 Less than 8 weeks gestation of pregnancy: Secondary | ICD-10-CM | POA: Diagnosis not present

## 2019-10-14 DIAGNOSIS — O3481 Maternal care for other abnormalities of pelvic organs, first trimester: Secondary | ICD-10-CM

## 2019-10-14 DIAGNOSIS — O219 Vomiting of pregnancy, unspecified: Secondary | ICD-10-CM | POA: Diagnosis not present

## 2019-10-14 DIAGNOSIS — Z3490 Encounter for supervision of normal pregnancy, unspecified, unspecified trimester: Secondary | ICD-10-CM

## 2019-10-14 NOTE — Patient Instructions (Signed)
Initial steps to help :   B6 (pyridoxine) 25 mg,  3-4 times a day Unisom (doxylamine) 25 mg at bedtime **B6 and Unisom are available as a combination prescription medications called diclegis and bonjesta  B1 (thiamin)  50-100 mg 1-4 a day  Continue prenatal vitamin with iron and thiamin. If it is not tolerated switch to 1 mg of folic acid.  Can add medication for gastric reflux if needed.  Subsequent steps to be added to B1, B6, and Unisom:  1. Antihistamine (one of the following medications) Dramamine      25-50 mg every 4-6 hours Benadryl      25-50 mg every 4-6 hours Meclizine      25 mg every 6 hours  2. Dopamine Antagonist (one of the following medications) Metoclopramide  (Reglan)  5-10 mg every 6-8 hours         PO Promethazine   (Phenergan)   12.5-25 mg every 4-6 hours      PO or rectal Prochlorperazine  (Compazine)  5-10 mg every 6-8 hours     25mg  BID rectally    Subsequent steps if there has still not been improvement in symptoms:  3. Daily stool softner  4. Ondansetron  (Zofran)   4-8 mg every 6-8 hours     OVER THE COUNTER MEDICINES  SAFE IN PREGNANCY   COMPLAINT                                        MEDICINE                                                 Constipation Add fiber supplements such as Metamucil, Fibercon, or Citrucel.  Increasing fiber intake via bran cereals, oatmeal, leafy greens, and prunes is another alternative.  Increase you fluid intake.  You may also add Colace 100mg  by mouth twice daily or Senakot   Cough/Cold      Robitusin, Mucinex  Cuts and  Scrapes Bacitracin, Neosporin, Plysporin  Dental Pain     Tylenol. Avoid aspirin  products, which  could cause bleeding problems for mother and baby.  Also avoid ibuprofen products, such as Advil, Motrin or Aleve unless your doctor or nurse specifically recommends these drugs.  Diarrhea Immodium, Kaopectate, or Donnagel PG .  Ensure adequate hydration by taking in plenty of  clear liquids such as Gatorade, ginger ale, and jello.  Call if symptoms persist over 24-hrs.  Do NOT take Pepto-Bismol  Fever Take Tylenol *, Extra-Strength Tylenol *, or any aspirin-free pain reliever (acetaminophen).  Avoid aspirin products, which could cause bleeding problems for mother and baby.  Also avoid ibuprofen products, such as Advil, Motrin or Aleve unless your doctor or nurse specifically recommends these drugs.  If you temperature is over 101o F (38.3o C), call the  clinic  Gas    Gaviscon,, Mylicon,  Riopan    Headache   Tylenol. Avoid aspirin  products, which  could cause bleeding problems for mother and baby.  Also avoid ibuprofen products, such as Advil, Motrin or Aleve unless your doctor or nurse specifically recommends these drugs.  Head Lice     Nix  Heartburn/Indigestion TUMS, Rolaids, Zantac, Mylanta. Maalox   Avoid fatty, fried, or spicy foods.  Eat small frequent meals 5-6 times daily as opposed to 3 large meals a day.  Hemorrhoids Annusol HC, Tucks , Perperation H , or Dermaplast.  Warm sitz baths for 30 minutes twice daily.  Air dry and sleep without underwear.  Avoid constipation and see recommendation above for constipation as this may exacerbate/worsen you hemorrhoids  Leg Cramps     TUMS, Vitamin with Potassium  Leg Swelling  Elevate legs above                                                                                            waist, lay on your left side.  Well fitting  shoes and support  hose  Muscle Aches                Tylenol. Avoid aspirin products, which  could cause bleeding problems for mother and baby.  Also avoid ibuprofen products, such as Advil, Motrin or Aleve unless your doctor or nurse specifically recommends these drugs.  Nausea/Vomiting Emetrol.  Supportive measures to ensure you stay adequately hydrated.  Sips of juice,  Gatorade, ginger ale 4 times an hour.  If you are unable to tolerate fluid intake for greater than 8hrs call the clinic.  You may want to allow ginger ale to go flat as carbonation my precipitate emesis.  Over the counter chewable prenatal vitamins are available.  Vitamin B6 (pyridoxine) 10 to 24mg  every 8hrs and doxylamine (Unisome sleep tabs) 25mg  at bedtime and 12.5mg  in the morning is first line.  Note that B6 supplements are not FDA regulated, there is a prescription of the above combination commercially available called Bonjesta.  Nosebleeds Apply ice pack to nose.  Particularly in winter time with dryer air, consider the use of a humidifier   Painful urination    Call clinic  Rash/ Itch Benadryl, Aveeno, Cortaid.  For severe whole body itching in the late second early third trimester call the office  Sleep Problems    Benadryl, Tylenol  PM, or Unisom  Seasonal Allergies    Claritin, Zyrtec,          Benadryl, Mucinex  Sinus Congestion Tylenol with Sudafed (pseudoephedrine no phenylephrine) or Actifed.  Sore Throat Chloroseptic lozenges and sprays such as Cepacol.  Salt water gargles (1 teaspoon of table salt in one quart of water)  Call if associated temperature above 101 Fahrenheit or 38.3 Celsius  Yeast Infection     Monistat

## 2019-10-14 NOTE — Progress Notes (Signed)
Patient ID: Kellie Mejia, female   DOB: 1991-09-09, 28 y.o.   MRN: [id number]  Reason for Consult: Follow-up (U/S follow CRL [redacted]w[redacted]d)   Referred by No ref. provider found  Subjective:     HPI:  Kellie Mejia is a 28 y.o. female. She has been feeling well since I last saw her.  Some continued nausea.   Past Medical History:  Diagnosis Date  . Anxiety   . Asthma    excercise induced  . Depression   . GERD (gastroesophageal reflux disease)    Family History  Problem Relation Age of Onset  . Hypertension Maternal Grandfather    Past Surgical History:  Procedure Laterality Date  . CESAREAN SECTION N/A 07/17/2015   Procedure: CESAREAN SECTION;  Surgeon: Suzy Bouchard, MD;  Location: [facility] ORS;  Service: Obstetrics;  Laterality: N/A;  . WISDOM TOOTH EXTRACTION      Short Social History:  Social History   Tobacco Use  . Smoking status: Never Smoker  . Smokeless tobacco: Never Used  Substance Use Topics  . Alcohol use: Not Currently    No Known Allergies  Current Outpatient Medications  Medication Sig Dispense Refill  . Prenatal Vit-Fe Fumarate-FA (PRENATAL VITAMIN) 27-0.8 MG TABS Take 1 tablet by mouth daily. 100 tablet 0  . albuterol (PROVENTIL HFA;VENTOLIN HFA) 108 (90 Base) MCG/ACT inhaler Inhale 2 puffs into the lungs every 6 (six) hours as needed for wheezing or shortness of breath. (Patient not taking: Reported on 09/27/2019) 1 Inhaler 2  . albuterol-ipratropium (COMBIVENT) 18-103 MCG/ACT inhaler Inhale 1 puff into the lungs every 6 (six) hours as needed for wheezing or shortness of breath. (Patient not taking: Reported on 09/27/2019) 1 Inhaler 2  . esomeprazole (NEXIUM) 40 MG capsule Take 1 capsule (40 mg total) by mouth daily. 30 capsule 1  . ibuprofen (ADVIL) 600 MG tablet Take 1 tablet (600 mg total) by mouth every 6 (six) hours as needed. (Patient not taking: Reported on 09/27/2019) 30 tablet 0  . ondansetron (ZOFRAN) 4 MG tablet Take 1  tablet (4 mg total) by mouth daily as needed. (Patient not taking: Reported on 10/14/2019) 10 tablet 0  . triamcinolone cream (KENALOG) 0.1 % Apply 1 application topically 4 (four) times daily. (Patient not taking: Reported on 09/27/2019) 30 g 3   No current facility-administered medications for this visit.     Review of Systems  Constitutional: Negative for chills, fatigue, fever and unexpected weight change.  HENT: Negative for trouble swallowing.  Eyes: Negative for loss of vision.  Respiratory: Negative for cough, shortness of breath and wheezing.  Cardiovascular: Negative for chest pain, leg swelling, palpitations and syncope.  GI: Negative for abdominal pain, blood in stool, diarrhea, nausea and vomiting.  GU: Negative for difficulty urinating, dysuria, frequency and hematuria.  Musculoskeletal: Negative for back pain, leg pain and joint pain.  Skin: Negative for rash.  Neurological: Negative for dizziness, headaches, light-headedness, numbness and seizures.  Psychiatric: Negative for behavioral problem, confusion, depressed mood and sleep disturbance.        Objective:  Objective   Vitals:   10/14/19 1640  Weight: 208 lb (94.3 kg)  Height: 5\' 4"  (1.626 m)   Body mass index is 35.7 kg/m.  Physical Exam Vitals signs and nursing note reviewed.  Constitutional:      Appearance: She is well-developed.  HENT:     Head: Normocephalic and atraumatic.  Eyes:     Pupils: Pupils are equal, round, and reactive to light.  Cardiovascular:     Rate and Rhythm: Normal rate and regular rhythm.  Pulmonary:     Effort: Pulmonary effort is normal. No respiratory distress.  Skin:    General: Skin is warm and dry.  Neurological:     Mental Status: She is alert and oriented to person, place, and time.  Psychiatric:        Behavior: Behavior normal.        Thought Content: Thought content normal.        Judgment: Judgment normal.        Assessment/Plan:     28 yo with IUP-  confirmed today by [place].  Patient taking PNV. Discussed nausea/vomiting hyperemesis in pregnancy. Patient will initiate B1/B6 and unisom.  Desires Maternit21 testing, will follow up in 3 weeks for NOB.  Continued surveillance of appendix.   More than 15 minutes were spent face to face with the patient in the room with more than 50% of the time spent providing counseling and discussing the plan of management.      Adrian Prows MD [facility] 10/14/2019 4:54 PM

## 2019-11-03 NOTE — Progress Notes (Deleted)
New Obstetric Patient H&P    Chief Complaint: "Desires prenatal care"   History of Present Illness: Patient is a 28 y.o. G2P1001 Not Hispanic or Latino female, LMP *** presents with amenorrhea and positive home pregnancy test. Based on her  LMP, her EDD is Estimated Date of Delivery: 06/01/20 and her EGA is 540w6d. Cycles are {0-35:19561} {days/wks/mos/yrs:310907}, {Desc; regular/irreg:14544}, and occur approximately every : {numbers 22-35:14824} days. Her last pap smear was {numbers (fuzzy):14653} years ago and was {Findings; lab pap smear results:16707::"no abnormalities"}.    She had a urine pregnancy test which was positive {numbers (fuzzy):14653} {time frame:9076}  ago. Her last menstrual period was normal and lasted for  {numbers (fuzzy):14653} {time frame:9076}. Since her LMP she claims she has experienced ***. She denies vaginal bleeding. Her past medical history is {Noncontribuatory/Contributory:21644}. Her prior pregnancies are notable for {pregnancy complications:12320}  Since her LMP, she admits to the use of tobacco products  {yes/no:63} She claims she has gained   {inf wt change:14817} pounds since the start of her pregnancy.  There are cats in the home in the home  {yes/no:63} If yes {Desc; indoor/outdoor:13239} She admits close contact with children on a regular basis  {yes/no:63}  She has had chicken pox in the past {yes/no/unknown:74} She has had Tuberculosis exposures, symptoms, or previously tested positive for TB   {yes/no:63} Current or past history of domestic violence. {yes/no:63}  Genetic Screening/Teratology Counseling: (Includes patient, baby's father, or anyone in either family with:)   1. [facility] 74[facility]  {yes/no:63} 2. Thalassemia ([place], [place], Mediterranean, or Asian background): MCV<80  {yes/no:63} 3. Neural tube defect (meningomyelocele, spina bifida, anencephaly)  {yes/no:63} 4. Congenital heart defect  {yes/no:63}  5. Down syndrome   {yes/no:63} 6. Tay-Sachs (Jewish, [place])  {yes/no:63} 7. Canavan's Disease  {yes/no:63} 8. Sickle cell disease or trait (African)  {yes/no:63}  9. Hemophilia or other blood disorders  {yes/no:63}  10. Muscular dystrophy  {yes/no:63}  11. Cystic fibrosis  {yes/no:63}  12. Huntington's Chorea  {yes/no:63}  13. Mental retardation/autism  {yes/no:63} 14. Other inherited genetic or chromosomal disorder  {yes/no:63} 15. Maternal metabolic disorder (DM, PKU, etc)  {yes/no:63} 16. Patient or FOB with a child with a birth defect not listed above no  16a. Patient or FOB with a birth defect themselves {yes/no:63} 17. Recurrent pregnancy loss, or stillbirth  {yes/no:63}  18. Any medications since LMP other than prenatal vitamins (include vitamins, supplements, OTC meds, drugs, alcohol)  {yes/no:63} 19. Any other genetic/environmental exposure to discuss  {yes/no:63}  Infection History:   1. Lives with someone with TB or TB exposed  {yes/no:63}  2. Patient or partner has history of genital herpes  {yes/no:63} 3. Rash or viral illness since LMP  {yes/no:63} 4. History of STI (GC, CT, HPV, syphilis, HIV)  {yes/no:63} 5. History of recent travel :  {yes/no:63}  Other pertinent information:  {yes/no:63}     Review of Systems:10 point review of systems negative unless otherwise noted in HPI  Past Medical History:  Past Medical History:  Diagnosis Date  . Anxiety   . Asthma    excercise induced  . Depression   . GERD (gastroesophageal reflux disease)     Past Surgical History:  Past Surgical History:  Procedure Laterality Date  . CESAREAN SECTION N/A 07/17/2015   Procedure: CESAREAN SECTION;  Surgeon: Suzy Bouchardhomas J Schermerhorn, MD;  Location: [facility] ORS;  Service: Obstetrics;  Laterality: N/A;  . WISDOM TOOTH EXTRACTION  Gynecologic History: Patient's last menstrual period was 08/26/2019 (exact date).  Obstetric History: G2P1001  Family History:  Family History  Problem  Relation Age of Onset  . Hypertension Maternal Grandfather     Social History:  Social History   Socioeconomic History  . Marital status: Single    Spouse name: Not on file  . Number of children: Not on file  . Years of education: Not on file  . Highest education level: Not on file  Occupational History  . Not on file  Tobacco Use  . Smoking status: Never Smoker  . Smokeless tobacco: Never Used  Substance and Sexual Activity  . Alcohol use: Not Currently  . Drug use: Not Currently    Types: Marijuana  . Sexual activity: Yes  Other Topics Concern  . Not on file  Social History Narrative  . Not on file   Social Determinants of Health   Financial Resource Strain:   . Difficulty of Paying Living Expenses: Not on file  Food Insecurity:   . Worried About Charity fundraiser in the Last Year: Not on file  . Ran Out of Food in the Last Year: Not on file  Transportation Needs:   . Lack of Transportation (Medical): Not on file  . Lack of Transportation (Non-Medical): Not on file  Physical Activity:   . Days of Exercise per Week: Not on file  . Minutes of Exercise per Session: Not on file  Stress:   . Feeling of Stress : Not on file  Social Connections:   . Frequency of Communication with Friends and Family: Not on file  . Frequency of Social Gatherings with Friends and Family: Not on file  . Attends Religious Services: Not on file  . Active Member of Clubs or Organizations: Not on file  . Attends Archivist Meetings: Not on file  . Marital Status: Not on file  Intimate Partner Violence: Unknown  . Fear of Current or Ex-Partner: No  . Emotionally Abused: No  . Physically Abused: No  . Sexually Abused: Not on file    Allergies:  No Known Allergies  Medications: Prior to Admission medications   Medication Sig Start Date End Date Taking? Authorizing Provider  albuterol (PROVENTIL HFA;VENTOLIN HFA) 108 (90 Base) MCG/ACT inhaler Inhale 2 puffs into the lungs  every 6 (six) hours as needed for wheezing or shortness of breath. Patient not taking: Reported on 09/27/2019 02/17/16   Arlyss Repress, PA-C  albuterol-ipratropium (COMBIVENT) 18-103 MCG/ACT inhaler Inhale 1 puff into the lungs every 6 (six) hours as needed for wheezing or shortness of breath. Patient not taking: Reported on 09/27/2019 03/01/16   Beers, Pierce Crane, PA-C  esomeprazole (NEXIUM) 40 MG capsule Take 1 capsule (40 mg total) by mouth daily. 08/26/18 08/26/19  Nena Polio, MD  ibuprofen (ADVIL) 600 MG tablet Take 1 tablet (600 mg total) by mouth every 6 (six) hours as needed. Patient not taking: Reported on 09/27/2019 06/10/19   Laban Emperor, PA-C  ondansetron (ZOFRAN) 4 MG tablet Take 1 tablet (4 mg total) by mouth daily as needed. Patient not taking: Reported on 10/14/2019 10/01/19 09/30/20  Merlyn Lot, MD  Prenatal Vit-Fe Fumarate-FA (PRENATAL VITAMIN) 27-0.8 MG TABS Take 1 tablet by mouth daily. 09/27/19 01/05/20  Caren Macadam, MD  triamcinolone cream (KENALOG) 0.1 % Apply 1 application topically 4 (four) times daily. Patient not taking: Reported on 09/27/2019 06/24/16   Cuthriell, Charline Bills, PA-C    Physical Exam Vitals: Last  menstrual period 08/26/2019.  General: NAD HEENT: normocephalic, anicteric Thyroid: no enlargement, no palpable nodules Pulmonary: No increased work of breathing, CTAB Cardiovascular: RRR, distal pulses 2+ Abdomen: NABS, soft, non-tender, non-distended.  Umbilicus without lesions.  No hepatomegaly, splenomegaly or masses palpable. No evidence of hernia  Genitourinary:  External: Normal external female genitalia.  Normal urethral meatus, normal  Bartholin's and Skene's glands.    Vagina: Normal vaginal mucosa, no evidence of prolapse.    Cervix: Grossly normal in appearance, no bleeding  Uterus: *** Non-enlarged, mobile, normal contour.  No CMT  Adnexa: ovaries non-enlarged, no adnexal masses  Rectal: deferred Extremities: no edema,  erythema, or tenderness Neurologic: Grossly intact Psychiatric: mood appropriate, affect full   Assessment: 28 y.o. G2P1001 at [redacted]w[redacted]d presenting to initiate prenatal care  Plan: 1) Avoid alcoholic beverages. 2) Patient encouraged not to smoke.  3) Discontinue the use of all non-medicinal drugs and chemicals.  4) Take prenatal vitamins daily.  5) Nutrition, food safety (fish, cheese advisories, and high nitrite foods) and exercise discussed. 6) Hospital and practice style discussed with cross coverage system.  7) Genetic Screening, such as with 1st Trimester Screening, cell free fetal DNA, AFP testing, and Ultrasound, as well as with amniocentesis and CVS as appropriate, is discussed with patient. At the conclusion of today's visit patient {Desc; requested/declined/undecided:14580} genetic testing 8) Patient is asked about travel to areas at risk for the Zika virus, and counseled to avoid travel and exposure to mosquitoes or sexual partners who may have themselves been exposed to the virus. Testing is discussed, and will be ordered as appropriate.

## 2019-11-04 ENCOUNTER — Encounter: Payer: 59 | Admitting: Certified Nurse Midwife

## 2019-11-13 ENCOUNTER — Other Ambulatory Visit: Payer: Self-pay

## 2019-11-13 ENCOUNTER — Emergency Department
Admission: EM | Admit: 2019-11-13 | Discharge: 2019-11-13 | Disposition: A | Discharge status: Home / Self Care | Payer: Medicaid Other | Attending: Emergency Medicine | Admitting: Emergency Medicine

## 2019-11-13 ENCOUNTER — Encounter: Payer: Self-pay | Admitting: Emergency Medicine

## 2019-11-13 DIAGNOSIS — Z3A11 11 weeks gestation of pregnancy: Secondary | ICD-10-CM | POA: Diagnosis not present

## 2019-11-13 DIAGNOSIS — O219 Vomiting of pregnancy, unspecified: Secondary | ICD-10-CM | POA: Insufficient documentation

## 2019-11-13 DIAGNOSIS — J45990 Exercise induced bronchospasm: Secondary | ICD-10-CM | POA: Insufficient documentation

## 2019-11-13 LAB — BASIC METABOLIC PANEL
Anion gap: 10 (ref 5–15)
BUN: 8 mg/dL (ref 6–20)
CO2: 22 mmol/L (ref 22–32)
Calcium: 8.9 mg/dL (ref 8.9–10.3)
Chloride: 104 mmol/L (ref 98–111)
Creatinine, Ser: 0.7 mg/dL (ref 0.44–1.00)
GFR calc Af Amer: >60 mL/min (ref >60)
GFR calc non Af Amer: >60 mL/min (ref >60)
Glucose, Bld: 100 mg/dL — ABNORMAL HIGH (ref 70–99)
Potassium: 3.2 mmol/L — ABNORMAL LOW (ref 3.5–5.1)
Sodium: 136 mmol/L (ref 135–145)

## 2019-11-13 LAB — CBC
HCT: 39.8 % (ref 36.0–46.0)
Hemoglobin: 14.1 g/dL (ref 12.0–15.0)
MCH: 29.3 pg (ref 26.0–34.0)
MCHC: 35.4 g/dL (ref 30.0–36.0)
MCV: 82.7 fL (ref 80.0–100.0)
Platelets: 300 10*3/uL (ref 150–400)
RBC: 4.81 MIL/uL (ref 3.87–5.11)
RDW: 12.3 % (ref 11.5–15.5)
WBC: 9.9 10*3/uL (ref 4.0–10.5)
nRBC: 0 % (ref 0.0–0.2)

## 2019-11-13 LAB — HCG, QUANTITATIVE, PREGNANCY: hCG, Beta Chain, Quant, S: 84007 m[IU]/mL — ABNORMAL HIGH (ref <5)

## 2019-11-13 MED ORDER — METOCLOPRAMIDE HCL 5 MG/ML IJ SOLN
10.0000 mg | Freq: Once | INTRAMUSCULAR | Status: AC
  Administered 2019-11-13: 10 mg via INTRAVENOUS
  Filled 2019-11-13: qty 2

## 2019-11-13 MED ORDER — PROMETHAZINE HCL 25 MG PO TABS: 25.0000 mg | ORAL_TABLET | ORAL | 1 refills | Status: DC | PRN

## 2019-11-13 MED ORDER — DEXTROSE 5 % IN LACTATED RINGERS IV BOLUS
1000.0000 mL | Freq: Once | INTRAVENOUS | Status: AC
  Administered 2019-11-13: 1000 mL via INTRAVENOUS
  Filled 2019-11-13: qty 1000

## 2019-11-13 MED ORDER — SODIUM CHLORIDE 0.9% FLUSH: 3.0000 mL | Freq: Once | INTRAVENOUS | Status: DC

## 2019-11-13 MED ORDER — ONDANSETRON HCL 4 MG/2ML IJ SOLN
4.0000 mg | Freq: Once | INTRAMUSCULAR | Status: AC
  Administered 2019-11-13: 4 mg via INTRAVENOUS
  Filled 2019-11-13: qty 2

## 2019-11-13 NOTE — ED Triage Notes (Signed)
Pt reports is [redacted] weeks pregnant and for the last 2 days has been vomiting and now feels weak and has a HA.

## 2019-11-13 NOTE — ED Provider Notes (Signed)
[facility] Emergency Department Provider Note       Time seen: ----------------------------------------- 7:58 AM on 11/13/2019 -----------------------------------------   I have reviewed the triage vital signs and the nursing notes.  HISTORY   Chief Complaint Emesis, Nausea, Weakness, and Headache   HPI Kellie Mejia is a 28 y.o. female with a history of anxiety, asthma, depression, GERD who presents to the ED for vomiting.  Patient reports she is [redacted] weeks pregnant for the last 2 days has been vomiting and now feels weak and has a headache.  She denies any vaginal bleeding or leakage of fluid.  Past Medical History:  Diagnosis Date  . Anxiety   . Asthma    excercise induced  . Depression   . GERD (gastroesophageal reflux disease)     Patient Active Problem List   Diagnosis Date Noted  . Postoperative state 07/17/2015  . Labor and delivery, indication for care 07/16/2015  . Pregnancy 07/10/2015  . Abdominal pain 07/01/2015  . Abdominal pain affecting pregnancy 06/14/2015  . Indication for care in labor and delivery, antepartum 05/27/2015    Past Surgical History:  Procedure Laterality Date  . CESAREAN SECTION N/A 07/17/2015   Procedure: CESAREAN SECTION;  Surgeon: Suzy Bouchard, MD;  Location: [facility] ORS;  Service: Obstetrics;  Laterality: N/A;  . WISDOM TOOTH EXTRACTION      Allergies Patient has no known allergies.  Social History Social History   Tobacco Use  . Smoking status: Never Smoker  . Smokeless tobacco: Never Used  Substance Use Topics  . Alcohol use: Not Currently  . Drug use: Not Currently    Types: Marijuana   Review of Systems Constitutional: Negative for fever. Cardiovascular: Negative for chest pain. Respiratory: Negative for shortness of breath. Gastrointestinal: Negative for abdominal pain, positive for vomiting Musculoskeletal: Negative for back pain. Skin: Negative for rash. Neurological:  Positive for headache  All systems negative/normal/unremarkable except as stated in the HPI  ____________________________________________   PHYSICAL EXAM:  VITAL SIGNS: ED Triage Vitals  Enc Vitals Group     BP --      Pulse --      Resp --      Temp --      Temp src --      SpO2 --      Weight 11/13/19 0752 210 lb (95.3 kg)     Height 11/13/19 0752 5\' 4"  (1.626 m)     Head Circumference --      Peak Flow --      Pain Score 11/13/19 0751 0     Pain Loc --      Pain Edu? --      Excl. in GC? --    Constitutional: Alert and oriented. Well appearing and in no distress. Eyes: Conjunctivae are normal. Normal extraocular movements. ENT      Head: Normocephalic and atraumatic.      Nose: No congestion/rhinnorhea.      Mouth/Throat: Mucous membranes are moist.      Neck: No stridor. Cardiovascular: Normal rate, regular rhythm. No murmurs, rubs, or gallops. Respiratory: Normal respiratory effort without tachypnea nor retractions. Breath sounds are clear and equal bilaterally. No wheezes/rales/rhonchi. Gastrointestinal: Soft and nontender. Normal bowel sounds Musculoskeletal: Nontender with normal range of motion in extremities. No lower extremity tenderness nor edema. Neurologic:  Normal speech and language. No gross focal neurologic deficits are appreciated.  Skin:  Skin is warm, dry and intact. No rash noted. Psychiatric: Mood and affect  are normal. Speech and behavior are normal.  ____________________________________________  ED COURSE:  As part of my medical decision making, I reviewed the following data within the [place] History obtained from family if available, nursing notes, old chart and ekg, as well as notes from prior ED visits. Patient presented for vomiting in early pregnancy, we will assess with labs and imaging as indicated at this time.  EKG: Interpreted by me, sinus rhythm with rate of 88 bpm, normal PR interval, normal QRS, normal QT    Procedures  Kellie Mejia was evaluated in Emergency Department on 11/13/2019 for the symptoms described in the history of present illness. She was evaluated in the context of the global COVID-19 pandemic, which necessitated consideration that the patient might be at risk for infection with the SARS-CoV-2 virus that causes COVID-19. Institutional protocols and algorithms that pertain to the evaluation of patients at risk for COVID-19 are in a state of rapid change based on information released by regulatory bodies including the CDC and federal and state organizations. These policies and algorithms were followed during the patient's care in the ED.  ____________________________________________   LABS (pertinent positives/negatives)  Labs Reviewed  BASIC METABOLIC PANEL - Abnormal; Notable for the following components:      Result Value   Potassium 3.2 (*)    Glucose, Bld 100 (*)    All other components within normal limits  CBC  URINALYSIS, COMPLETE (UACMP) WITH MICROSCOPIC  HCG, QUANTITATIVE, PREGNANCY  CBG MONITORING, ED   ____________________________________________   DIFFERENTIAL DIAGNOSIS   Hyperemesis gravidarum, dehydration, electrolyte abnormality, gastroenteritis  FINAL ASSESSMENT AND PLAN  Vomiting in early pregnancy   Plan: The patient had presented for resistant vomiting in early pregnancy. Patient's labs were overall reassuring, potassium is not low enough to treat at this point.  I will discharge her home with Phenergan, she is cleared for outpatient follow-up.   Laurence Aly, MD    Note: This note was generated in part or whole with voice recognition software. Voice recognition is usually quite accurate but there are transcription errors that can and very often do occur. I apologize for any typographical errors that were not detected and corrected.     Earleen Newport, MD 11/13/19 [phone]

## 2019-11-27 ENCOUNTER — Ambulatory Visit (INDEPENDENT_AMBULATORY_CARE_PROVIDER_SITE_OTHER): Payer: Self-pay | Admitting: Obstetrics & Gynecology

## 2019-11-27 ENCOUNTER — Other Ambulatory Visit: Payer: Self-pay

## 2019-11-27 ENCOUNTER — Encounter: Payer: Self-pay | Admitting: Obstetrics & Gynecology

## 2019-11-27 ENCOUNTER — Other Ambulatory Visit (HOSPITAL_COMMUNITY)
Admission: RE | Admit: 2019-11-27 | Discharge: 2019-11-27 | Disposition: A | Discharge status: Home / Self Care | Payer: Medicaid Other | Source: Ambulatory Visit | Attending: Obstetrics & Gynecology | Admitting: Obstetrics & Gynecology

## 2019-11-27 VITALS — BP 120/80 | Wt 195.0 lb

## 2019-11-27 DIAGNOSIS — Z124 Encounter for screening for malignant neoplasm of cervix: Secondary | ICD-10-CM | POA: Diagnosis present

## 2019-11-27 DIAGNOSIS — Z1379 Encounter for other screening for genetic and chromosomal anomalies: Secondary | ICD-10-CM

## 2019-11-27 DIAGNOSIS — Z3490 Encounter for supervision of normal pregnancy, unspecified, unspecified trimester: Secondary | ICD-10-CM | POA: Insufficient documentation

## 2019-11-27 DIAGNOSIS — Z113 Encounter for screening for infections with a predominantly sexual mode of transmission: Secondary | ICD-10-CM

## 2019-11-27 DIAGNOSIS — Z3A13 13 weeks gestation of pregnancy: Secondary | ICD-10-CM

## 2019-11-27 DIAGNOSIS — Z3481 Encounter for supervision of other normal pregnancy, first trimester: Secondary | ICD-10-CM

## 2019-11-27 MED ORDER — DOXYLAMINE-PYRIDOXINE 10-10 MG PO TBEC: 2.0000 | DELAYED_RELEASE_TABLET | Freq: Every day | ORAL | 5 refills | Status: DC

## 2019-11-27 MED ORDER — ONDANSETRON 4 MG PO TBDP: 4.0000 mg | ORAL_TABLET | Freq: Four times a day (QID) | ORAL | 0 refills | Status: DC | PRN

## 2019-11-27 NOTE — Progress Notes (Signed)
11/27/2019   Chief Complaint: Missed period  Transfer of Care Patient: no  History of Present Illness: Kellie Mejia is a 29 y.o. G2P1001 [redacted]w[redacted]d based on Patient's last menstrual period was 08/26/2019 (exact date). with an Estimated Date of Delivery: 06/01/20, with the above CC.   Her periods were: regular periods every 28 days She was using no method when she conceived.  She has Positive signs or symptoms of nausea/vomiting of pregnancy. She has Negative signs or symptoms of miscarriage or preterm labor She identifies Negative Zika risk factors for her and her partner On any different medications around the time she conceived/early pregnancy: No  History of varicella: Yes   ROS: A 12-point review of systems was performed and negative, except as stated in the above HPI.  OBGYN History: As per HPI. OB History  Gravida Para Term Preterm AB Living  2 1 1  0 0 1  SAB TAB Ectopic Multiple Live Births  0 0 0 0 1    # Outcome Date GA Lbr Len/2nd Weight Sex Delivery Anes PTL Lv  2 Current           1 Term 07/17/15 [redacted]w[redacted]d  7 lb 7.6 oz (3.391 kg) F CS-LTranv EPI  LIV    Any issues with any prior pregnancies: CS for FITL early in labor (3 cm) in 2016 Any prior children are healthy, doing well, without any problems or issues: yes History of pap smears: Yes. Last pap smear 2019. Abnormal: no  History of STIs: No   Past Medical History: Past Medical History:  Diagnosis Date  . Anxiety   . Asthma    excercise induced  . Depression   . GERD (gastroesophageal reflux disease)     Past Surgical History: Past Surgical History:  Procedure Laterality Date  . CESAREAN SECTION N/A 07/17/2015   Procedure: CESAREAN SECTION;  Surgeon: Boykin Nearing, MD;  Location: [facility] ORS;  Service: Obstetrics;  Laterality: N/A;  . WISDOM TOOTH EXTRACTION      Family History:  Family History  Problem Relation Age of Onset  . Hypertension Maternal Grandfather    She denies any female cancers, bleeding  or blood clotting disorders.  She denies any history of mental retardation, birth defects or genetic disorders in her or the FOB's history  Social History:  Social History   Socioeconomic History  . Marital status: Single    Spouse name: Not on file  . Number of children: Not on file  . Years of education: Not on file  . Highest education level: Not on file  Occupational History  . Not on file  Tobacco Use  . Smoking status: Never Smoker  . Smokeless tobacco: Never Used  Substance and Sexual Activity  . Alcohol use: Not Currently  . Drug use: Not Currently    Types: Marijuana  . Sexual activity: Yes  Other Topics Concern  . Not on file  Social History Narrative  . Not on file   Social Determinants of Health   Financial Resource Strain:   . Difficulty of Paying Living Expenses: Not on file  Food Insecurity:   . Worried About Charity fundraiser in the Last Year: Not on file  . Ran Out of Food in the Last Year: Not on file  Transportation Needs:   . Lack of Transportation (Medical): Not on file  . Lack of Transportation (Non-Medical): Not on file  Physical Activity:   . Days of Exercise per Week: Not on file  .  Minutes of Exercise per Session: Not on file  Stress:   . Feeling of Stress : Not on file  Social Connections:   . Frequency of Communication with Friends and Family: Not on file  . Frequency of Social Gatherings with Friends and Family: Not on file  . Attends Religious Services: Not on file  . Active Member of Clubs or Organizations: Not on file  . Attends Banker Meetings: Not on file  . Marital Status: Not on file  Intimate Partner Violence: Unknown  . Fear of Current or Ex-Partner: No  . Emotionally Abused: No  . Physically Abused: No  . Sexually Abused: Not on file   Any pets in the household: Cat  Allergy: No Known Allergies  Current Outpatient Medications:  Current Outpatient Medications:  .  albuterol (PROVENTIL HFA;VENTOLIN  HFA) 108 (90 Base) MCG/ACT inhaler, Inhale 2 puffs into the lungs every 6 (six) hours as needed for wheezing or shortness of breath. (Patient not taking: Reported on 11/27/2019), Disp: 1 Inhaler, Rfl: 2 .  albuterol-ipratropium (COMBIVENT) 18-103 MCG/ACT inhaler, Inhale 1 puff into the lungs every 6 (six) hours as needed for wheezing or shortness of breath. (Patient not taking: Reported on 11/27/2019), Disp: 1 Inhaler, Rfl: 2 .  Doxylamine-Pyridoxine (DICLEGIS) 10-10 MG TBEC, Take 2 tablets by mouth at bedtime. If symptoms persist, add one tablet in the morning and one in the afternoon, Disp: 100 tablet, Rfl: 5 .  esomeprazole (NEXIUM) 40 MG capsule, Take 1 capsule (40 mg total) by mouth daily., Disp: 30 capsule, Rfl: 1 .  ondansetron (ZOFRAN ODT) 4 MG disintegrating tablet, Take 1 tablet (4 mg total) by mouth every 6 (six) hours as needed for nausea., Disp: 20 tablet, Rfl: 0 .  ondansetron (ZOFRAN) 4 MG tablet, Take 1 tablet (4 mg total) by mouth daily as needed. (Patient not taking: Reported on 11/27/2019), Disp: 10 tablet, Rfl: 0 .  Prenatal Vit-Fe Fumarate-FA (PRENATAL VITAMIN) 27-0.8 MG TABS, Take 1 tablet by mouth daily. (Patient not taking: Reported on 11/27/2019), Disp: 100 tablet, Rfl: 0 .  triamcinolone cream (KENALOG) 0.1 %, Apply 1 application topically 4 (four) times daily. (Patient not taking: Reported on 11/27/2019), Disp: 30 g, Rfl: 3   Physical Exam:   BP 120/80   Wt 195 lb (88.5 kg)   LMP 08/26/2019 (Exact Date) Comment: normal  BMI 33.47 kg/m  Body mass index is 33.47 kg/m. Constitutional: Well nourished, well developed female in no acute distress.  Neck:  Supple, normal appearance, and no thyromegaly  Cardiovascular: S1, S2 normal, no murmur, rub or gallop, regular rate and rhythm Respiratory:  Clear to auscultation bilateral. Normal respiratory effort Abdomen: positive bowel sounds and no masses, hernias; diffusely non tender to palpation, non distended Breasts: breasts appear  normal, no suspicious masses, no skin or nipple changes or axillary nodes. Neuro/Psych:  Normal mood and affect.  Skin:  Warm and dry.  Lymphatic:  No inguinal lymphadenopathy.   Pelvic exam: is not limited by body habitus EGBUS: within normal limits, Vagina: within normal limits and with no blood in the vault, Cervix: normal appearing cervix without discharge or lesions, closed/long/high, Uterus:  enlarged: 12 weeks, and Adnexa:  no mass, fullness, tenderness  Assessment: Kellie Mejia is a 30 y.o. G2P1001 [redacted]w[redacted]d based on Patient's last menstrual period was 08/26/2019 (exact date). with an Estimated Date of Delivery: 06/01/20,  for prenatal care.  Plan:  1) Avoid alcoholic beverages. 2) Patient encouraged not to smoke.  3) Discontinue the use  of all non-medicinal drugs and chemicals.  4) Take prenatal vitamins daily.  5) Seatbelt use advised 6) Nutrition, food safety (fish, cheese advisories, and high nitrite foods) and exercise discussed. 7) Hospital and practice style delivering at [facility] discussed  8) Patient is asked about travel to areas at risk for the Zika virus, and counseled to avoid travel and exposure to mosquitoes or sexual partners who may have themselves been exposed to the virus. Testing is discussed, and will be ordered as appropriate.  9) Childbirth classes at [facility] advised 10) Genetic Screening, such as with 1st Trimester Screening, cell free fetal DNA, AFP testing, and Ultrasound, as well as with amniocentesis and CVS as appropriate, is discussed with patient. She plans to have genetic testing this pregnancy.  Done today. 11) Desires VBAC, counseled. OB/GYN  Counseling Note 29 y.o. G2P1001 at [redacted]w[redacted]d with Estimated Date of Delivery: 06/01/20 was seen today in office to discuss trial of labor after cesarean section (TOLAC) versus elective repeat cesarean delivery (ERCD). The following risks were discussed with the patient.  Risk of uterine rupture at term is 0.78 percent with TOLAC  and 0.22 percent with ERCD. 1 in 10 uterine ruptures will result in neonatal death or neurological injury. The benefits of a trial of labor after cesarean (TOLAC) resulting in a vaginal birth after cesarean (VBAC) include the following: shorter length of hospital stay and postpartum recovery (in most cases); fewer complications, such as postpartum fever, wound or uterine infection, thromboembolism (blood clots in the leg or lung), need for blood transfusion and fewer neonatal breathing problems.  The risks of an attempted VBAC or TOLAC include the following: Risk of failed trial of labor after cesarean (TOLAC) without a vaginal birth after cesarean (VBAC) resulting in repeat cesarean delivery (RCD) in about 20 to 40 percent of women who attempt VBAC.  Risk of rupture of uterus resulting in an emergency cesarean delivery. The risk of uterine rupture may be related in part to the type of uterine incision made during the first cesarean delivery. A previous transverse uterine incision has the lowest risk of rupture (0.2 to 1.5 percent risk). Vertical or T-shaped uterine incisions have a higher risk of uterine rupture (4 to 9 percent risk)The risk of fetal death is very low with both VBAC and elective repeat cesarean delivery (ERCD), but the likelihood of fetal death is higher with VBAC than with ERCD. Maternal death is very rare with either type of delivery.  The risks of an elective repeat cesarean delivery (ERCD) were reviewed with the patient including but not limited to: 12/998 risk of uterine rupture which could have serious consequences, bleeding which may require transfusion; infection which may require antibiotics; injury to bowel, bladder or other surrounding organs (bowel, bladder, ureters); injury to the fetus; need for additional procedures including hysterectomy in the event of a life-threatening hemorrhage; thromboembolic phenomenon; abnormal placentation; incisional problems; death and other  postoperative or anesthesia complications.     Problem list reviewed and updated.  Annamarie Major, MD, [facility], [facility] 11/27/2019  2:52 PM

## 2019-11-27 NOTE — Patient Instructions (Addendum)
Nausea- Diclegis to prevent (take two pills nightly, may increase to morning, noon and night dosing as needed) Zofran when feeling nausea, dissolves on tongue  Commonly Asked Questions During Pregnancy  Cats: A parasite can be excreted in cat feces.  To avoid exposure you need to have another person empty the little box.  If you must empty the litter box you will need to wear gloves.  Wash your hands after handling your cat.  This parasite can also be found in raw or undercooked meat so this should also be avoided.  Colds, Sore Throats, Flu: Please check your medication sheet to see what you can take for symptoms.  If your symptoms are unrelieved by these medications please call the office.  Dental Work: Most any dental work Investment banker, corporate recommends is permitted.  X-rays should only be taken during the first trimester if absolutely necessary.  Your abdomen should be shielded with a lead apron during all x-rays.  Please notify your provider prior to receiving any x-rays.  Novocaine is fine; gas is not recommended.  If your dentist requires a note from [place] prior to dental work please call the office and we will provide one for you.  Exercise: Exercise is an important part of staying healthy during your pregnancy.  You may continue most exercises you were accustomed to prior to pregnancy.  Later in your pregnancy you will most likely notice you have difficulty with activities requiring balance like riding a bicycle.  It is important that you listen to your body and avoid activities that put you at a higher risk of falling.  Adequate rest and staying well hydrated are a must!  If you have questions about the safety of specific activities ask your provider.    Exposure to Children with illness: Try to avoid obvious exposure; report any symptoms to [place] when noted,  If you have chicken pos, red measles or mumps, you should be immune to these diseases.   Please do not take any vaccines while pregnant unless you  have checked with your OB provider.  Fetal Movement: After 28 weeks we recommend you do "kick counts" twice daily.  Lie or sit down in a calm quiet environment and count your baby movements "kicks".  You should feel your baby at least 10 times per hour.  If you have not felt 10 kicks within the first hour get up, walk around and have something sweet to eat or drink then repeat for an additional hour.  If count remains less than 10 per hour notify your provider.  Fumigating: Follow your pest control agent's advice as to how long to stay out of your home.  Ventilate the area well before re-entering.  Hemorrhoids:   Most over-the-counter preparations can be used during pregnancy.  Check your medication to see what is safe to use.  It is important to use a stool softener or fiber in your diet and to drink lots of liquids.  If hemorrhoids seem to be getting worse please call the office.   Hot Tubs:  Hot tubs Jacuzzis and saunas are not recommended while pregnant.  These increase your internal body temperature and should be avoided.  Intercourse:  Sexual intercourse is safe during pregnancy as long as you are comfortable, unless otherwise advised by your provider.  Spotting may occur after intercourse; report any bright red bleeding that is heavier than spotting.  Labor:  If you know that you are in labor, please go to the hospital.  If  you are unsure, please call the office and let us help you decide what to do.  Lifting, straining, etc:  If your job requires heavy lifting or straining please check with your provider for any limitations.  Generally, you should not lift items heavier than that you can lift simply with your hands and arms (no back muscles)  Painting:  Paint fumes do not harm your pregnancy, but may make you ill and should be avoided if possible.  Latex or water based paints have less odor than oils.  Use adequate ventilation while painting.  Permanents & Hair Color:  Chemicals in hair  dyes are not recommended as they cause increase hair dryness which can increase hair loss during pregnancy.  " Highlighting" and permanents are allowed.  Dye may be absorbed differently and permanents may not hold as well during pregnancy.  Sunbathing:  Use a sunscreen, as skin burns easily during pregnancy.  Drink plenty of fluids; avoid over heating.  Tanning Beds:  Because their possible side effects are still unknown, tanning beds are not recommended.  Ultrasound Scans:  Routine ultrasounds are performed at approximately 20 weeks.  You will be able to see your baby's general anatomy an if you would like to know the gender this can usually be determined as well.  If it is questionable when you conceived you may also receive an ultrasound early in your pregnancy for dating purposes.  Otherwise ultrasound exams are not routinely performed unless there is a medical necessity.  Although you can request a scan we ask that you pay for it when conducted because insurance does not cover " patient request" scans.  Work: If your pregnancy proceeds without complications you may work until your due date, unless your physician or employer advises otherwise.  Round Ligament Pain/Pelvic Discomfort:  Sharp, shooting pains not associated with bleeding are fairly common, usually occurring in the second trimester of pregnancy.  They tend to be worse when standing up or when you remain standing for long periods of time.  These are the result of pressure of certain pelvic ligaments called "round ligaments".  Rest, Tylenol and heat seem to be the most effective relief.  As the womb and fetus grow, they rise out of the pelvis and the discomfort improves.  Please notify the office if your pain seems different than that described.  It may represent a more serious condition.

## 2019-11-28 LAB — RPR+RH+ABO+RUB AB+AB SCR+CB...
Antibody Screen: NEGATIVE
HIV Screen 4th Generation wRfx: NONREACTIVE
Hematocrit: 42.9 % (ref 34.0–46.6)
Hemoglobin: 14.3 g/dL (ref 11.1–15.9)
Hepatitis B Surface Ag: NEGATIVE
MCH: 29.5 pg (ref 26.6–33.0)
MCHC: 33.3 g/dL (ref 31.5–35.7)
MCV: 89 fL (ref 79–97)
Platelets: 300 10*3/uL (ref 150–450)
RBC: 4.84 x10E6/uL (ref 3.77–5.28)
RDW: 12.9 % (ref 11.7–15.4)
RPR Ser Ql: NONREACTIVE
Rh Factor: POSITIVE
Rubella Antibodies, IGG: 4.38 index (ref >0.99)
Varicella zoster IgG: 1429 index (ref >165)
WBC: 11 10*3/uL — ABNORMAL HIGH (ref 3.4–10.8)

## 2019-11-29 LAB — CYTOLOGY - PAP
Chlamydia: NEGATIVE
Comment: NEGATIVE
Comment: NORMAL
Diagnosis: NEGATIVE
Neisseria Gonorrhea: NEGATIVE

## 2019-11-30 LAB — URINE CULTURE

## 2019-12-03 LAB — MATERNIT21 PLUS CORE+SCA
Fetal Fraction: 7
Monosomy X (Turner Syndrome): NOT DETECTED
Result (T21): NEGATIVE
Trisomy 13 (Patau syndrome): NEGATIVE
Trisomy 18 (Edwards syndrome): NEGATIVE
Trisomy 21 (Down syndrome): NEGATIVE
XXX (Triple X Syndrome): NOT DETECTED
XXY (Klinefelter Syndrome): NOT DETECTED
XYY (Jacobs Syndrome): NOT DETECTED

## 2019-12-25 ENCOUNTER — Ambulatory Visit (INDEPENDENT_AMBULATORY_CARE_PROVIDER_SITE_OTHER): Payer: Self-pay | Admitting: Obstetrics & Gynecology

## 2019-12-25 ENCOUNTER — Other Ambulatory Visit: Payer: Self-pay

## 2019-12-25 ENCOUNTER — Encounter: Payer: Self-pay | Admitting: Obstetrics and Gynecology

## 2019-12-25 DIAGNOSIS — Z3689 Encounter for other specified antenatal screening: Secondary | ICD-10-CM

## 2019-12-25 DIAGNOSIS — R109 Unspecified abdominal pain: Secondary | ICD-10-CM

## 2019-12-25 DIAGNOSIS — Z3A17 17 weeks gestation of pregnancy: Secondary | ICD-10-CM

## 2019-12-25 DIAGNOSIS — Z3490 Encounter for supervision of normal pregnancy, unspecified, unspecified trimester: Secondary | ICD-10-CM

## 2019-12-25 NOTE — Progress Notes (Signed)
Virtual Visit via Telephone Note  I connected with patient on 12/25/19 at 10:20 AM EST by telephone and verified that I am speaking with the correct person using two identifiers.   I discussed the limitations, risks, security and privacy concerns of performing an evaluation and management service by telephone and the availability of in person appointments. I also discussed with the patient that there may be a patient responsible charge related to this service. The patient expressed understanding and agreed to proceed.  The patient was at Home I spoke with the patient from my  Office  Kellie Mejia is a 29 y.o. G2P1001 at [redacted]w[redacted]d being seen today for ongoing prenatal care.  She is currently monitored for the following issues for this low-risk pregnancy and has Abdominal pain; Pregnancy; and Encounter for supervision of low-risk pregnancy, antepartum on their problem list.  ----------------------------------------------------------------------------------- Patient reports no complaints.   Denies pain, VB, leaking of fluid.  ----------------------------------------------------------------------------------- The following portions of the patient's history were reviewed and updated as appropriate: allergies, current medications, past family history, past medical history, past social history, past surgical history and problem list. Problem list updated.   Objective  Last menstrual period 08/26/2019. Pregravid weight 205 lb (93 kg) Total Weight Gain -10 lb (-4.536 kg)  Physical Exam could not be performed. Because of the COVID-19 outbreak this visit was performed over the phone and not in person.   Assessment   29 y.o. G2P1001 at [redacted]w[redacted]d by  06/01/2020, by Last Menstrual Period presenting for routine prenatal visit  Plan  Gestational age appropriate obstetric precautions including but not limited to vaginal bleeding, contractions, leaking of fluid and fetal movement were reviewed in detail  with the patient.    Follow Up Instructions: 3 weeks, w [place]    I discussed the assessment and treatment plan with the patient. The patient was provided an opportunity to ask questions and all were answered. The patient agreed with the plan and demonstrated an understanding of the instructions.   The patient was advised to call back or seek an in-person evaluation if the symptoms worsen or if the condition fails to improve as anticipated.  I provided 12 minutes of non-face-to-face time during this encounter.  Return in about 3 weeks (around 01/15/2020) for ROB w Anat [place].  Annamarie Major, MD [facility], [facility] 12/25/2019 10:57 AM

## 2020-01-16 ENCOUNTER — Ambulatory Visit (INDEPENDENT_AMBULATORY_CARE_PROVIDER_SITE_OTHER): Payer: Self-pay | Admitting: Obstetrics & Gynecology

## 2020-01-16 ENCOUNTER — Ambulatory Visit (INDEPENDENT_AMBULATORY_CARE_PROVIDER_SITE_OTHER): Payer: Self-pay

## 2020-01-16 ENCOUNTER — Encounter: Payer: Self-pay | Admitting: Obstetrics & Gynecology

## 2020-01-16 ENCOUNTER — Other Ambulatory Visit: Payer: Self-pay

## 2020-01-16 VITALS — BP 100/60 | Wt 203.0 lb

## 2020-01-16 DIAGNOSIS — Z3A20 20 weeks gestation of pregnancy: Secondary | ICD-10-CM

## 2020-01-16 DIAGNOSIS — Z3689 Encounter for other specified antenatal screening: Secondary | ICD-10-CM

## 2020-01-16 DIAGNOSIS — Z98891 History of uterine scar from previous surgery: Secondary | ICD-10-CM

## 2020-01-16 DIAGNOSIS — O34211 Maternal care for low transverse scar from previous cesarean delivery: Secondary | ICD-10-CM

## 2020-01-16 DIAGNOSIS — N2889 Other specified disorders of kidney and ureter: Secondary | ICD-10-CM

## 2020-01-16 DIAGNOSIS — Z131 Encounter for screening for diabetes mellitus: Secondary | ICD-10-CM

## 2020-01-16 DIAGNOSIS — Z3490 Encounter for supervision of normal pregnancy, unspecified, unspecified trimester: Secondary | ICD-10-CM

## 2020-01-16 NOTE — Patient Instructions (Signed)
Prenatal Ultrasound A prenatal ultrasound exam, also called a sonogram, is an imaging test that allows your health care provider to see your baby and placenta in the uterus. This is a safe and painless test that does not expose you or your baby to any X-rays, needles, or medicines. Prenatal ultrasounds are done using a handheld plastic device (transducer) that sends out sound waves (ultrasound). The sound waves reflect off your baby's bones and other tissues to create moving images on a computer screen. There are two types of prenatal ultrasound:  Transabdominal ultrasound. During this test, a transducer is placed on your belly and moved around. A routine transabdominal ultrasound is usually done between weeks 18 and 22 of pregnancy (standard ultrasound). It may also be done between weeks 13 and 14.  Transvaginal ultrasound. During this test, a transducer that is shaped like a wand is placed inside your vagina. This type of ultrasound is usually done during early pregnancy. Prenatal ultrasounds may be used to check:  How far along your pregnancy is (stage).  Your baby's development (gestational age).  The location and condition of the organ that supplies your baby with nourishment and oxygen (placenta).  Your baby's heart rate, position, and movements.  Your baby's approximate size and weight.  The amount of fluid surrounding your baby (amniotic fluid).  If you are carrying more than one baby.  Your baby's sex (if your baby is in a position that allows the sex organs to be seen, and if you choose to learn the sex at this time).  If there are any possible problems that require more testing, such as genetic problems.  If your pregnancy is forming outside your uterus (ectopic pregnancy). You may have other ultrasounds as needed at any point during your pregnancy. If your health care provider suspects a problem, you may also have a more detailed type of transabdominal ultrasound (advanced  ultrasound). What are the risks? Generally, this is a safe test. There are no known risks for you or your baby from a prenatal ultrasound. What happens before the test?  Before a transabdominal ultrasound, you may be asked to drink fluid 2 hours before the exam and avoid emptying your bladder. A full bladder helps the images show up more clearly.  Before a transvaginal ultrasound, you may be asked to empty your bladder before the exam.  Wear loose, comfortable clothing so it is easy to undress or expose your lower belly for the exam. What happens during the test? If you are having a transabdominal ultrasound:  You will lie on an exam table.  Your belly will be exposed.  Gel will be rubbed over your belly.  The transducer will be pressed on your belly and moved back and forth, through the gel. You may feel slight pressure, but there should not be any pain.  You may be asked to change your position.  You may hear sounds of blood flow and your baby's heartbeat. You may be able to see images of your baby on the computer screen. Your health care provider may measure your baby's head and other body parts, looking for normal development.  After the exam, the gel will be cleaned off, and you can replace your clothing. You will be able to empty your bladder after the exam is done. If you are having a transvaginal ultrasound:  You will change into a hospital gown or undress from the waist down and cover yourself with a paper sheet.  You will lie down on   an exam table with your feet in footrests (stirrups).  The transducer will be covered with a protective cover and lubricated.  The transducer will be inserted into your vagina.  You may hear sounds of blood flow and your baby's heartbeat. You may be able to see images of your baby on the computer screen.  After the exam, the transducer will be removed, and you can put your clothes back on. What can I expect after the test?  You can  drive yourself home and return to all your normal activities.  A health care provider trained in interpreting ultrasounds will review the images taken during your exam and send a report to your health care provider.  It is up to you to get your test results. Ask your health care provider, or the department that is doing the test, when your results will be ready. Questions to ask your health care provider  Why am I having this prenatal ultrasound?  What information will this exam provide?  How much does this exam cost? What costs will my insurance cover?  Can my partner or support person be with me during the exam?  When can I expect to get the results? Summary  A prenatal ultrasound is a safe and painless imaging exam that gives information about your pregnancy and your developing baby.  Transvaginal ultrasound exams are often done in early pregnancy. Standard transabdominal ultrasounds are typically done between 18 and 22 weeks of pregnancy. You may have other prenatal ultrasounds as needed.  This exam has no risks for you or your baby. After the exam, you can go home and return to all your usual activities. This information is not intended to replace advice given to you by your health care provider. Make sure you discuss any questions you have with your health care provider. Document Revised: 03/01/2019 Document Reviewed: 01/10/2018 Elsevier Patient Education  2020 [employer]  

## 2020-01-16 NOTE — Progress Notes (Signed)
  Subjective  Fetal Movement? yes Contractions? no Leaking Fluid? no Vaginal Bleeding? no Min nausea  Objective  BP 100/60   Wt 203 lb (92.1 kg)   LMP 08/26/2019 (Exact Date) Comment: normal  BMI 34.84 kg/m  General: NAD Pumonary: no increased work of breathing Abdomen: gravid, non-tender Extremities: no edema Psychiatric: mood appropriate, affect full  Assessment  29 y.o. G2P1001 at [redacted]w[redacted]d by  06/01/2020, by Last Menstrual Period presenting for routine prenatal visit  Plan   Problem List Items Addressed This Visit      Other   Pregnancy - Primary   Encounter for supervision of low-risk pregnancy, antepartum   History of cesarean delivery    Other Visit Diagnoses    Screening for diabetes mellitus       Relevant Orders   28 Week RH+Panel   Pelviectasis       Relevant Orders   US OB Follow Up    PNV Review of ULTRASOUND. I have personally reviewed images and report of recent ultrasound done at [facility]. There is a singleton gestation with subjectively normal amniotic fluid volume. The fetal biometry correlates with established dating. Detailed evaluation of the fetal anatomy was performed.The fetal anatomical survey appears within normal limits within the resolution of ultrasound as described above.  It must be noted that a normal ultrasound is unable to rule out fetal aneuploidy.    Breast feeding if able (wqs not able to last pregnancy Patch is preferred contraceptive choice  The following were addressed during this visit:  Breastfeeding Education - Early initiation of breastfeeding  - The importance of exclusive breastfeeding  - Risks of giving your baby anything other than breast milk if you are breastfeeding  - Nonpharmacological pain relief methods for labor  - The importance of early skin-to-skin contact  - Rooming-in on a 24-hour basis  - Feeding on demand or baby-led feeding  - Frequent feeding to help assure optimal milk production  - Effective  positioning and attachment  - Exclusive breastfeeding for the first 6 months  - Individualized Education   18-21 weeks - Ultrasound    Kellie Major, MD, [facility], [facility] 01/16/2020  11:17 AM

## 2020-02-13 ENCOUNTER — Encounter: Payer: Medicaid Other | Admitting: Advanced Practice Midwife

## 2020-02-13 ENCOUNTER — Encounter: Payer: Self-pay | Admitting: Advanced Practice Midwife

## 2020-02-13 ENCOUNTER — Other Ambulatory Visit: Payer: Self-pay

## 2020-02-13 ENCOUNTER — Ambulatory Visit (INDEPENDENT_AMBULATORY_CARE_PROVIDER_SITE_OTHER): Payer: Medicaid Other | Admitting: Advanced Practice Midwife

## 2020-02-13 DIAGNOSIS — Z113 Encounter for screening for infections with a predominantly sexual mode of transmission: Secondary | ICD-10-CM

## 2020-02-13 DIAGNOSIS — Z3482 Encounter for supervision of other normal pregnancy, second trimester: Secondary | ICD-10-CM | POA: Diagnosis not present

## 2020-02-13 DIAGNOSIS — Z130 Encounter for screening for diseases of the blood and blood-forming organs and certain disorders involving the immune mechanism: Secondary | ICD-10-CM

## 2020-02-13 DIAGNOSIS — Z3490 Encounter for supervision of normal pregnancy, unspecified, unspecified trimester: Secondary | ICD-10-CM

## 2020-02-13 DIAGNOSIS — Z131 Encounter for screening for diabetes mellitus: Secondary | ICD-10-CM

## 2020-02-13 DIAGNOSIS — Z3A24 24 weeks gestation of pregnancy: Secondary | ICD-10-CM

## 2020-02-13 NOTE — Progress Notes (Signed)
    Routine Prenatal Care Visit- Virtual Visit  Subjective   Virtual Visit via Telephone Note  I connected with Kellie Mejia on 02/13/20 at 10:45 AM EDT by telephone and verified that I am speaking with the correct person using two identifiers.   I discussed the limitations, risks, security and privacy concerns of performing an evaluation and management service by telephone and the availability of in person appointments. I also discussed with the patient that there may be a patient responsible charge related to this service. The patient expressed understanding and agreed to proceed.  The patient was at home I spoke with the patient from my  Office phone The names of people involved in this encounter were: Kellie Mejia and myself [name], CNM   An Kellie Mejia is a 29 y.o. G2P1001 at [redacted]w[redacted]d being seen today for ongoing prenatal care.  She is currently monitored for the following issues for this low-risk pregnancy and has Abdominal pain; Pregnancy; Encounter for supervision of low-risk pregnancy, antepartum; and History of cesarean delivery on their problem list.  ----------------------------------------------------------------------------------- Patient reports no complaints.    . Vag. Bleeding: None.  Movement: Present. Denies leaking of fluid.  ----------------------------------------------------------------------------------- The following portions of the patient's history were reviewed and updated as appropriate: allergies, current medications, past family history, past medical history, past social history, past surgical history and problem list. Problem list updated.   Objective  Last menstrual period 08/26/2019. Pregravid weight 205 lb (93 kg) Total Weight Gain -2 lb (-0.907 kg) Urinalysis:      Fetal Status:     Movement: Present     Physical Exam could not be performed. Because of the COVID-19 outbreak this visit was performed over the phone and not in  person.   Assessment   29 y.o. G2P1001 at [redacted]w[redacted]d by  06/01/2020, by Last Menstrual Period presenting for routine prenatal visit  Plan    Gestational age appropriate obstetric precautions including but not limited to vaginal bleeding, contractions, leaking of fluid and fetal movement were reviewed in detail with the patient.     Follow Up Instructions: 28 week labs at next visit   I discussed the assessment and treatment plan with the patient. The patient was provided an opportunity to ask questions and all were answered. The patient agreed with the plan and demonstrated an understanding of the instructions.   The patient was advised to call back or seek an in-person evaluation if the symptoms worsen or if the condition fails to improve as anticipated.  I provided 10 minutes of non-face-to-face time during this encounter.  Return in about 4 weeks (around 03/12/2020) for 28 wk labs and rob.   [name], CNM [facility] 02/13/2020, 12:31 PM

## 2020-02-14 NOTE — Progress Notes (Signed)
This encounter was created in error - please disregard.

## 2020-03-12 ENCOUNTER — Other Ambulatory Visit: Payer: Self-pay

## 2020-03-12 ENCOUNTER — Other Ambulatory Visit: Payer: Medicaid Other

## 2020-03-12 ENCOUNTER — Encounter: Payer: Self-pay | Admitting: Obstetrics & Gynecology

## 2020-03-12 ENCOUNTER — Ambulatory Visit (INDEPENDENT_AMBULATORY_CARE_PROVIDER_SITE_OTHER): Payer: Medicaid Other

## 2020-03-12 ENCOUNTER — Ambulatory Visit (INDEPENDENT_AMBULATORY_CARE_PROVIDER_SITE_OTHER): Payer: Medicaid Other | Admitting: Obstetrics & Gynecology

## 2020-03-12 VITALS — BP 120/70 | Wt 208.0 lb

## 2020-03-12 DIAGNOSIS — Z3A28 28 weeks gestation of pregnancy: Secondary | ICD-10-CM

## 2020-03-12 DIAGNOSIS — N2889 Other specified disorders of kidney and ureter: Secondary | ICD-10-CM | POA: Diagnosis not present

## 2020-03-12 DIAGNOSIS — Z113 Encounter for screening for infections with a predominantly sexual mode of transmission: Secondary | ICD-10-CM

## 2020-03-12 DIAGNOSIS — Z131 Encounter for screening for diabetes mellitus: Secondary | ICD-10-CM

## 2020-03-12 DIAGNOSIS — Z130 Encounter for screening for diseases of the blood and blood-forming organs and certain disorders involving the immune mechanism: Secondary | ICD-10-CM

## 2020-03-12 DIAGNOSIS — Z3490 Encounter for supervision of normal pregnancy, unspecified, unspecified trimester: Secondary | ICD-10-CM

## 2020-03-12 DIAGNOSIS — Z3493 Encounter for supervision of normal pregnancy, unspecified, third trimester: Secondary | ICD-10-CM

## 2020-03-12 LAB — POCT URINALYSIS DIPSTICK OB
Glucose, UA: NEGATIVE
POC,PROTEIN,UA: NEGATIVE

## 2020-03-12 NOTE — Progress Notes (Signed)
  Subjective  Fetal Movement? yes Contractions? no Leaking Fluid? no Vaginal Bleeding? no Min nausea Objective  BP 120/70   Wt 208 lb (94.3 kg)   LMP 08/26/2019 (Exact Date) Comment: normal  BMI 35.70 kg/m  General: NAD Pumonary: no increased work of breathing Abdomen: gravid, non-tender Extremities: no edema Psychiatric: mood appropriate, affect full  Assessment  29 y.o. G2P1001 at [redacted]w[redacted]d by  06/01/2020, by Last Menstrual Period presenting for routine prenatal visit  Plan   Problem List Items Addressed This Visit      Other   Pregnancy - Primary   Relevant Orders   POC Urinalysis Dipstick OB (Completed)   Encounter for supervision of low-risk pregnancy, antepartum   Relevant Orders   US OB Follow Up    Other Visit Diagnoses    Screen for sexually transmitted diseases       Screening for iron deficiency anemia       Screening for diabetes mellitus        Review of ULTRASOUND. I have personally reviewed images and report of recent ultrasound done at [facility]. There is a singleton gestation with subjectively normal amniotic fluid volume. The fetal biometry correlates with established dating. Detailed evaluation of the fetal anatomy was performed.The fetal anatomical survey appears within normal limits within the resolution of ultrasound as described above.  It must be noted that a normal ultrasound is unable to rule out fetal aneuploidy.    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 Although most cases of mild renal pelvic dilation will resolve and not have a clinical impact on neonatal renal development, there are reports of persistent cases that require postnatal intervention. Will continue w [place] monitoring for oligohydramnios as well as fetal kidney measurements  Glucola today  Annamarie Major, MD, [facility], [facility] 03/12/2020  11:01 AM

## 2020-03-13 LAB — 28 WEEK RH+PANEL
Basophils Absolute: 0.1 10*3/uL (ref 0.0–0.2)
Basos: 1 %
EOS (ABSOLUTE): 0.2 10*3/uL (ref 0.0–0.4)
Eos: 3 %
Gestational Diabetes Screen: 125 mg/dL (ref 65–139)
HIV Screen 4th Generation wRfx: NONREACTIVE
Hematocrit: 35.9 % (ref 34.0–46.6)
Hemoglobin: 12 g/dL (ref 11.1–15.9)
Immature Grans (Abs): 0.1 10*3/uL (ref 0.0–0.1)
Immature Granulocytes: 1 %
Lymphocytes Absolute: 2 10*3/uL (ref 0.7–3.1)
Lymphs: 22 %
MCH: 30.1 pg (ref 26.6–33.0)
MCHC: 33.4 g/dL (ref 31.5–35.7)
MCV: 90 fL (ref 79–97)
Monocytes Absolute: 0.4 10*3/uL (ref 0.1–0.9)
Monocytes: 4 %
Neutrophils Absolute: 6.4 10*3/uL (ref 1.4–7.0)
Neutrophils: 69 %
Platelets: 262 10*3/uL (ref 150–450)
RBC: 3.99 x10E6/uL (ref 3.77–5.28)
RDW: 12.3 % (ref 11.7–15.4)
RPR Ser Ql: NONREACTIVE
WBC: 9.2 10*3/uL (ref 3.4–10.8)

## 2020-03-26 ENCOUNTER — Encounter: Payer: Medicaid Other | Admitting: Obstetrics & Gynecology

## 2020-03-26 ENCOUNTER — Encounter: Payer: Self-pay | Admitting: Advanced Practice Midwife

## 2020-03-26 ENCOUNTER — Other Ambulatory Visit: Payer: Self-pay

## 2020-03-26 ENCOUNTER — Ambulatory Visit (INDEPENDENT_AMBULATORY_CARE_PROVIDER_SITE_OTHER): Payer: Medicaid Other | Admitting: Advanced Practice Midwife

## 2020-03-26 VITALS — BP 122/68 | Wt 215.0 lb

## 2020-03-26 DIAGNOSIS — Z3493 Encounter for supervision of normal pregnancy, unspecified, third trimester: Secondary | ICD-10-CM

## 2020-03-26 DIAGNOSIS — Z3A30 30 weeks gestation of pregnancy: Secondary | ICD-10-CM

## 2020-03-26 NOTE — Progress Notes (Signed)
  Routine Prenatal Care Visit  Subjective  Kellie Mejia is a 29 y.o. G2P1001 at [redacted]w[redacted]d being seen today for ongoing prenatal care.  She is currently monitored for the following issues for this low-risk pregnancy and has Abdominal pain; Pregnancy; Encounter for supervision of low-risk pregnancy, antepartum; and History of cesarean delivery on their problem list.  ----------------------------------------------------------------------------------- Patient reports some hip and general muscle pain.   Contractions: Not present. Vag. Bleeding: None.  Movement: Present. Leaking Fluid denies.  ----------------------------------------------------------------------------------- The following portions of the patient's history were reviewed and updated as appropriate: allergies, current medications, past family history, past medical history, past social history, past surgical history and problem list. Problem list updated.  Objective  Blood pressure 122/68, weight 215 lb (97.5 kg), last menstrual period 08/26/2019. Pregravid weight 205 lb (93 kg) Total Weight Gain 10 lb (4.536 kg) Urinalysis: Urine Protein    Urine Glucose    Fetal Status: Fetal Heart Rate (bpm): 136 Fundal Height: 30 cm Movement: Present     General:  Alert, oriented and cooperative. Patient is in no acute distress.  Skin: Skin is warm and dry. No rash noted.   Cardiovascular: Normal heart rate noted  Respiratory: Normal respiratory effort, no problems with respiration noted  Abdomen: Soft, gravid, appropriate for gestational age. Pain/Pressure: Absent     Pelvic:  Cervical exam deferred        Extremities: Normal range of motion.  Edema: None  Mental Status: Normal mood and affect. Normal behavior. Normal judgment and thought content.   Assessment   29 y.o. G2P1001 at [redacted]w[redacted]d by  06/01/2020, by Last Menstrual Period presenting for routine prenatal visit  Plan   Hip/muscle pain: heat/ice, stretches/exercise, epsom salt  soaks, abdominal support band  Preterm labor symptoms and general obstetric precautions including but not limited to vaginal bleeding, contractions, leaking of fluid and fetal movement were reviewed in detail with the patient.    Return for has follow up scheduled.  [name], CNM 03/26/2020 2:31 PM

## 2020-04-08 ENCOUNTER — Other Ambulatory Visit: Payer: Self-pay

## 2020-04-08 ENCOUNTER — Ambulatory Visit (INDEPENDENT_AMBULATORY_CARE_PROVIDER_SITE_OTHER): Payer: Medicaid Other | Admitting: Obstetrics & Gynecology

## 2020-04-08 ENCOUNTER — Encounter: Payer: Self-pay | Admitting: Obstetrics & Gynecology

## 2020-04-08 ENCOUNTER — Ambulatory Visit (INDEPENDENT_AMBULATORY_CARE_PROVIDER_SITE_OTHER): Payer: Medicaid Other

## 2020-04-08 VITALS — BP 120/80 | Wt 216.0 lb

## 2020-04-08 DIAGNOSIS — Z23 Encounter for immunization: Secondary | ICD-10-CM | POA: Diagnosis not present

## 2020-04-08 DIAGNOSIS — Z3A32 32 weeks gestation of pregnancy: Secondary | ICD-10-CM

## 2020-04-08 DIAGNOSIS — Z98891 History of uterine scar from previous surgery: Secondary | ICD-10-CM | POA: Diagnosis not present

## 2020-04-08 DIAGNOSIS — Z3490 Encounter for supervision of normal pregnancy, unspecified, unspecified trimester: Secondary | ICD-10-CM

## 2020-04-08 DIAGNOSIS — O0993 Supervision of high risk pregnancy, unspecified, third trimester: Secondary | ICD-10-CM

## 2020-04-08 NOTE — Addendum Note (Signed)
Addended by: Cornelius Moras D on: 04/08/2020 09:48 AM   Modules accepted: Orders

## 2020-04-08 NOTE — Patient Instructions (Signed)
Third Trimester of Pregnancy The third trimester is from week 28 through week 40 (months 7 through 9). The third trimester is a time when the unborn baby (fetus) is growing rapidly. At the end of the ninth month, the fetus is about 20 inches in length and weighs 6-10 pounds. Body changes during your third trimester Your body will continue to go through many changes during pregnancy. The changes vary from woman to woman. During the third trimester:  Your weight will continue to increase. You can expect to gain 25-35 pounds (11-16 kg) by the end of the pregnancy.  You may begin to get stretch marks on your hips, abdomen, and breasts.  You may urinate more often because the fetus is moving lower into your pelvis and pressing on your bladder.  You may develop or continue to have heartburn. This is caused by increased hormones that slow down muscles in the digestive tract.  You may develop or continue to have constipation because increased hormones slow digestion and cause the muscles that push waste through your intestines to relax.  You may develop hemorrhoids. These are swollen veins (varicose veins) in the rectum that can itch or be painful.  You may develop swollen, bulging veins (varicose veins) in your legs.  You may have increased body aches in the pelvis, back, or thighs. This is due to weight gain and increased hormones that are relaxing your joints.  You may have changes in your hair. These can include thickening of your hair, rapid growth, and changes in texture. Some women also have hair loss during or after pregnancy, or hair that feels dry or thin. Your hair will most likely return to normal after your baby is born.  Your breasts will continue to grow and they will continue to become tender. A yellow fluid (colostrum) may leak from your breasts. This is the first milk you are producing for your baby.  Your belly button may stick out.  You may notice more swelling in your hands,  face, or ankles.  You may have increased tingling or numbness in your hands, arms, and legs. The skin on your belly may also feel numb.  You may feel short of breath because of your expanding uterus.  You may have more problems sleeping. This can be caused by the size of your belly, increased need to urinate, and an increase in your body's metabolism.  You may notice the fetus "dropping," or moving lower in your abdomen (lightening).  You may have increased vaginal discharge.  You may notice your joints feel loose and you may have pain around your pelvic bone. What to expect at prenatal visits You will have prenatal exams every 2 weeks until week 36. Then you will have weekly prenatal exams. During a routine prenatal visit:  You will be weighed to make sure you and the baby are growing normally.  Your blood pressure will be taken.  Your abdomen will be measured to track your baby's growth.  The fetal heartbeat will be listened to.  Any test results from the previous visit will be discussed.  You may have a cervical check near your due date to see if your cervix has softened or thinned (effaced).  You will be tested for Group B streptococcus. This happens between 35 and 37 weeks. Your health care provider may ask you:  What your birth plan is.  How you are feeling.  If you are feeling the baby move.  If you have had any abnormal   symptoms, such as leaking fluid, bleeding, severe headaches, or abdominal cramping.  If you are using any tobacco products, including cigarettes, chewing tobacco, and electronic cigarettes.  If you have any questions. Other tests or screenings that may be performed during your third trimester include:  Blood tests that check for low iron levels (anemia).  Fetal testing to check the health, activity level, and growth of the fetus. Testing is done if you have certain medical conditions or if there are problems during the pregnancy.  Nonstress test  (NST). This test checks the health of your baby to make sure there are no signs of problems, such as the baby not getting enough oxygen. During this test, a belt is placed around your belly. The baby is made to move, and its heart rate is monitored during movement. What is false labor? False labor is a condition in which you feel small, irregular tightenings of the muscles in the womb (contractions) that usually go away with rest, changing position, or drinking water. These are called Braxton Hicks contractions. Contractions may last for hours, days, or even weeks before true labor sets in. If contractions come at regular intervals, become more frequent, increase in intensity, or become painful, you should see your health care provider. What are the signs of labor?  Abdominal cramps.  Regular contractions that start at 10 minutes apart and become stronger and more frequent with time.  Contractions that start on the top of the uterus and spread down to the lower abdomen and back.  Increased pelvic pressure and dull back pain.  A watery or bloody mucus discharge that comes from the vagina.  Leaking of amniotic fluid. This is also known as your "water breaking." It could be a slow trickle or a gush. Let your health care provider know if it has a color or strange odor. If you have any of these signs, call your health care provider right away, even if it is before your due date. Follow these instructions at home: Medicines  Follow your health care provider's instructions regarding medicine use. Specific medicines may be either safe or unsafe to take during pregnancy.  Take a prenatal vitamin that contains at least 600 micrograms (mcg) of folic acid.  If you develop constipation, try taking a stool softener if your health care provider approves. Eating and drinking   Eat a balanced diet that includes fresh fruits and vegetables, whole grains, good sources of protein such as meat, eggs, or tofu,  and low-fat dairy. Your health care provider will help you determine the amount of weight gain that is right for you.  Avoid raw meat and uncooked cheese. These carry germs that can cause birth defects in the baby.  If you have low calcium intake from food, talk to your health care provider about whether you should take a daily calcium supplement.  Eat four or five small meals rather than three large meals a day.  Limit foods that are high in fat and processed sugars, such as fried and sweet foods.  To prevent constipation: ? Drink enough fluid to keep your urine clear or pale yellow. ? Eat foods that are high in fiber, such as fresh fruits and vegetables, whole grains, and beans. Activity  Exercise only as directed by your health care provider. Most women can continue their usual exercise routine during pregnancy. Try to exercise for 30 minutes at least 5 days a week. Stop exercising if you experience uterine contractions.  Avoid heavy lifting.  Do   not exercise in extreme heat or humidity, or at high altitudes.  Wear low-heel, comfortable shoes.  Practice good posture.  You may continue to have sex unless your health care provider tells you otherwise. Relieving pain and discomfort  Take frequent breaks and rest with your legs elevated if you have leg cramps or low back pain.  Take warm sitz baths to soothe any pain or discomfort caused by hemorrhoids. Use hemorrhoid cream if your health care provider approves.  Wear a good support bra to prevent discomfort from breast tenderness.  If you develop varicose veins: ? Wear support pantyhose or compression stockings as told by your healthcare provider. ? Elevate your feet for 15 minutes, 3-4 times a day. Prenatal care  Write down your questions. Take them to your prenatal visits.  Keep all your prenatal visits as told by your health care provider. This is important. Safety  Wear your seat belt at all times when driving.  Make  a list of emergency phone numbers, including numbers for family, friends, the hospital, and police and fire departments. General instructions  Avoid cat litter boxes and soil used by cats. These carry germs that can cause birth defects in the baby. If you have a cat, ask someone to clean the litter box for you.  Do not travel far distances unless it is absolutely necessary and only with the approval of your health care provider.  Do not use hot tubs, steam rooms, or saunas.  Do not drink alcohol.  Do not use any products that contain nicotine or tobacco, such as cigarettes and e-cigarettes. If you need help quitting, ask your health care provider.  Do not use any medicinal herbs or unprescribed drugs. These chemicals affect the formation and growth of the baby.  Do not douche or use tampons or scented sanitary pads.  Do not cross your legs for long periods of time.  To prepare for the arrival of your baby: ? Take prenatal classes to understand, practice, and ask questions about labor and delivery. ? Make a trial run to the hospital. ? Visit the hospital and tour the maternity area. ? Arrange for maternity or paternity leave through employers. ? Arrange for family and friends to take care of pets while you are in the hospital. ? Purchase a rear-facing car seat and make sure you know how to install it in your car. ? Pack your hospital bag. ? Prepare the baby's nursery. Make sure to remove all pillows and stuffed animals from the baby's crib to prevent suffocation.  Visit your dentist if you have not gone during your pregnancy. Use a soft toothbrush to brush your teeth and be gentle when you floss. Contact a health care provider if:  You are unsure if you are in labor or if your water has broken.  You become dizzy.  You have mild pelvic cramps, pelvic pressure, or nagging pain in your abdominal area.  You have lower back pain.  You have persistent nausea, vomiting, or  diarrhea.  You have an unusual or bad smelling vaginal discharge.  You have pain when you urinate. Get help right away if:  Your water breaks before 37 weeks.  You have regular contractions less than 5 minutes apart before 37 weeks.  You have a fever.  You are leaking fluid from your vagina.  You have spotting or bleeding from your vagina.  You have severe abdominal pain or cramping.  You have rapid weight loss or weight gain.  You have   shortness of breath with chest pain.  You notice sudden or extreme swelling of your face, hands, ankles, feet, or legs.  Your baby makes fewer than 10 movements in 2 hours.  You have severe headaches that do not go away when you take medicine.  You have vision changes. Summary  The third trimester is from week 28 through week 40, months 7 through 9. The third trimester is a time when the unborn baby (fetus) is growing rapidly.  During the third trimester, your discomfort may increase as you and your baby continue to gain weight. You may have abdominal, leg, and back pain, sleeping problems, and an increased need to urinate.  During the third trimester your breasts will keep growing and they will continue to become tender. A yellow fluid (colostrum) may leak from your breasts. This is the first milk you are producing for your baby.  False labor is a condition in which you feel small, irregular tightenings of the muscles in the womb (contractions) that eventually go away. These are called Braxton Hicks contractions. Contractions may last for hours, days, or even weeks before true labor sets in.  Signs of labor can include: abdominal cramps; regular contractions that start at 10 minutes apart and become stronger and more frequent with time; watery or bloody mucus discharge that comes from the vagina; increased pelvic pressure and dull back pain; and leaking of amniotic fluid. This information is not intended to replace advice given to you by your  health care provider. Make sure you discuss any questions you have with your health care provider. Document Revised: 02/28/2019 Document Reviewed: 12/13/2016 Elsevier Patient Education  2020 [employer]  

## 2020-04-08 NOTE — Progress Notes (Signed)
  Subjective  Fetal Movement? yes Contractions? no Leaking Fluid? no Vaginal Bleeding? no  Objective  BP 120/80   Wt 216 lb (98 kg)   LMP 08/26/2019 (Exact Date) Comment: normal  BMI 37.08 kg/m  General: NAD Pumonary: no increased work of breathing Abdomen: gravid, non-tender Extremities: no edema Psychiatric: mood appropriate, affect full  Review of ULTRASOUND.    I have personally reviewed images and report of recent ultrasound done at [facility].    Plan of management to be discussed with patient. Resolved kidney concerns  Assessment  29 y.o. G2P1001 at [redacted]w[redacted]d by  06/01/2020, by Last Menstrual Period presenting for routine prenatal visit  Plan   Problem List Items Addressed This Visit    High-risk pregnancy, third trimester    Resolved fetal renal pyelectasis    H/o CS   History of cesarean delivery    Plans VBAC, discussed again today   32 weeks    PNV, [facility]    TDaP today     Clinic [facility] Prenatal Labs  Dating [place] 6 weeks Blood type: A/Positive/-- (01/06 1459)   Genetic Screen NIPS:nml XX Antibody:Negative (01/06 1459)  Anatomic [place] WSOB, nml, mild pelviectasis  Rubella: 4.38 (01/06 1459) Varicella: Imm  GTT Third trimester: nml RPR: Non Reactive (01/06 1459)   Rhogam n/a HBsAg: Negative (01/06 1459)   TDaP vaccine  5/19        Flu Shot:no HIV: Non Reactive (01/06 1459)   Baby Food        Will try breast feeding, didn't go well last pregnancy  GBS: pending   Contraception Patch if not breast feeding Pap:11/27/2019  CBB  No   CS/VBAC VBAC desired (LTCS 2016)   Support Person      Annamarie Major, MD, [facility], [facility] 04/08/2020  9:41 AM

## 2020-04-22 ENCOUNTER — Ambulatory Visit (INDEPENDENT_AMBULATORY_CARE_PROVIDER_SITE_OTHER): Payer: Medicaid Other | Admitting: Obstetrics & Gynecology

## 2020-04-22 ENCOUNTER — Other Ambulatory Visit: Payer: Self-pay

## 2020-04-22 ENCOUNTER — Encounter: Payer: Self-pay | Admitting: Obstetrics & Gynecology

## 2020-04-22 VITALS — BP 120/80 | Wt 223.0 lb

## 2020-04-22 DIAGNOSIS — O0993 Supervision of high risk pregnancy, unspecified, third trimester: Secondary | ICD-10-CM

## 2020-04-22 DIAGNOSIS — Z98891 History of uterine scar from previous surgery: Secondary | ICD-10-CM

## 2020-04-22 DIAGNOSIS — Z3A34 34 weeks gestation of pregnancy: Secondary | ICD-10-CM

## 2020-04-22 NOTE — Progress Notes (Signed)
  Subjective  Fetal Movement? yes Contractions? no Leaking Fluid? no Vaginal Bleeding? no  Objective  BP 120/80   Wt 223 lb (101.2 kg)   LMP 08/26/2019 (Exact Date) Comment: normal  BMI 38.28 kg/m  General: NAD Pumonary: no increased work of breathing Abdomen: gravid, non-tender Extremities: no edema Psychiatric: mood appropriate, affect full  Assessment  29 y.o. G2P1001 at [redacted]w[redacted]d by  06/01/2020, by Last Menstrual Period presenting for routine prenatal visit  Plan   Problem List Items Addressed This Visit      Other   High-risk pregnancy, third trimester   History of cesarean delivery    Other Visit Diagnoses    [redacted] weeks gestation of pregnancy    -  Primary    OB/GYN  Counseling Note  29 y.o. G2P1001 at [redacted]w[redacted]d with Estimated Date of Delivery: 06/01/20 was seen today in office to discuss trial of labor after cesarean section (TOLAC) versus elective repeat cesarean delivery (ERCD). The following risks were discussed with the patient.  Risk of uterine rupture at term is 0.78 percent with TOLAC and 0.22 percent with ERCD. 1 in 10 uterine ruptures will result in neonatal death or neurological injury. The benefits of a trial of labor after cesarean (TOLAC) resulting in a vaginal birth after cesarean (VBAC) include the following: shorter length of hospital stay and postpartum recovery (in most cases); fewer complications, such as postpartum fever, wound or uterine infection, thromboembolism (blood clots in the leg or lung), need for blood transfusion and fewer neonatal breathing problems.  The risks of an attempted VBAC or TOLAC include the following: Risk of failed trial of labor after cesarean (TOLAC) without a vaginal birth after cesarean (VBAC) resulting in repeat cesarean delivery (RCD) in about 20 to 40 percent of women who attempt VBAC.  Risk of rupture of uterus resulting in an emergency cesarean delivery. The risk of uterine rupture may be related in part to the type of uterine  incision made during the first cesarean delivery. A previous transverse uterine incision has the lowest risk of rupture (0.2 to 1.5 percent risk). Vertical or T-shaped uterine incisions have a higher risk of uterine rupture (4 to 9 percent risk)The risk of fetal death is very low with both VBAC and elective repeat cesarean delivery (ERCD), but the likelihood of fetal death is higher with VBAC than with ERCD. Maternal death is very rare with either type of delivery.  The risks of an elective repeat cesarean delivery (ERCD) were reviewed with the patient including but not limited to: 12/998 risk of uterine rupture which could have serious consequences, bleeding which may require transfusion; infection which may require antibiotics; injury to bowel, bladder or other surrounding organs (bowel, bladder, ureters); injury to the fetus; need for additional procedures including hysterectomy in the event of a life-threatening hemorrhage; thromboembolic phenomenon; abnormal placentation; incisional problems; death and other postoperative or anesthesia complications.    Plans VBAC if natural labor, plans CS once passes EDC (to schedule nv)   PNV, FMC, PTL precautions  Annamarie Major, MD, [facility], [facility] 04/22/2020  2:28 PM

## 2020-04-22 NOTE — Patient Instructions (Signed)
Braxton Hicks Contractions °Contractions of the uterus can occur throughout pregnancy, but they are not always a sign that you are in labor. You may have practice contractions called Braxton Hicks contractions. These false labor contractions are sometimes confused with true labor. °What are Braxton Hicks contractions? °Braxton Hicks contractions are tightening movements that occur in the muscles of the uterus before labor. Unlike true labor contractions, these contractions do not result in opening (dilation) and thinning of the cervix. Toward the end of pregnancy (32-34 weeks), Braxton Hicks contractions can happen more often and may become stronger. These contractions are sometimes difficult to tell apart from true labor because they can be very uncomfortable. You should not feel embarrassed if you go to the hospital with false labor. °Sometimes, the only way to tell if you are in true labor is for your health care provider to look for changes in the cervix. The health care provider will do a physical exam and may monitor your contractions. If you are not in true labor, the exam should show that your cervix is not dilating and your water has not broken. °If there are no other health problems associated with your pregnancy, it is completely safe for you to be sent home with false labor. You may continue to have Braxton Hicks contractions until you go into true labor. °How to tell the difference between true labor and false labor °True labor °· Contractions last 30-70 seconds. °· Contractions become very regular. °· Discomfort is usually felt in the top of the uterus, and it spreads to the lower abdomen and low back. °· Contractions do not go away with walking. °· Contractions usually become more intense and increase in frequency. °· The cervix dilates and gets thinner. °False labor °· Contractions are usually shorter and not as strong as true labor contractions. °· Contractions are usually irregular. °· Contractions  are often felt in the front of the lower abdomen and in the groin. °· Contractions may go away when you walk around or change positions while lying down. °· Contractions get weaker and are shorter-lasting as time goes on. °· The cervix usually does not dilate or become thin. °Follow these instructions at home: ° °· Take over-the-counter and prescription medicines only as told by your health care provider. °· Keep up with your usual exercises and follow other instructions from your health care provider. °· Eat and drink lightly if you think you are going into labor. °· If Braxton Hicks contractions are making you uncomfortable: °? Change your position from lying down or resting to walking, or change from walking to resting. °? Sit and rest in a tub of warm water. °? Drink enough fluid to keep your urine pale yellow. Dehydration may cause these contractions. °? Do slow and deep breathing several times an hour. °· Keep all follow-up prenatal visits as told by your health care provider. This is important. °Contact a health care provider if: °· You have a fever. °· You have continuous pain in your abdomen. °Get help right away if: °· Your contractions become stronger, more regular, and closer together. °· You have fluid leaking or gushing from your vagina. °· You pass blood-tinged mucus (bloody show). °· You have bleeding from your vagina. °· You have low back pain that you never had before. °· You feel your baby’s head pushing down and causing pelvic pressure. °· Your baby is not moving inside you as much as it used to. °Summary °· Contractions that occur before labor are   called Braxton Hicks contractions, false labor, or practice contractions. °· Braxton Hicks contractions are usually shorter, weaker, farther apart, and less regular than true labor contractions. True labor contractions usually become progressively stronger and regular, and they become more frequent. °· Manage discomfort from Braxton Hicks contractions  by changing position, resting in a warm bath, drinking plenty of water, or practicing deep breathing. °This information is not intended to replace advice given to you by your health care provider. Make sure you discuss any questions you have with your health care provider. °Document Revised: 10/20/2017 Document Reviewed: 03/23/2017 °Elsevier Patient Education © 2020 [employer]vier Inc[employer] ° °

## 2020-04-24 NOTE — Telephone Encounter (Signed)
Sch CS for am of July 14 and block my schedule til 0900 in the office that morn  Surgery Booking Request Patient Full Name:  Kellie Mejia  MRN: [id number]  DOB: [birth year]  Surgeon: Letitia Libra, MD  Requested Surgery Date and Time: July 14 at 0730 Primary Diagnosis AND Code: Prior cesarean section Secondary Diagnosis and Code:  Surgical Procedure: Cesarean Section L&D Notification: Yes Admission Status: surgery admit Length of Surgery: 1 hr Special Case Needs: No H&P: Yes Phone Interview???:  Yes Interpreter: No Language:  Medical Clearance:  No Special Scheduling Instructions: no Any known health/anesthesia issues, diabetes, sleep apnea, latex allergy, defibrillator/pacemaker?: No Acuity: P1   (P1 highest, P2 delay may cause harm, P3 low, elective gyn, P4 lowest)

## 2020-04-24 NOTE — Telephone Encounter (Signed)
Do you want an RNFA requested or would you like for me to ask Jean Rosenthal to stay post call to assist?

## 2020-04-24 NOTE — Telephone Encounter (Signed)
The midwife on call can always assist w CS, so we will ask Erskine Squibb to come in early 730 to assist.  It is OK for Jean Rosenthal to stay to assist if he desires

## 2020-04-27 NOTE — Telephone Encounter (Signed)
Schedule blocked and patient's notified of schedule change

## 2020-04-29 ENCOUNTER — Telehealth: Payer: Self-pay | Admitting: Obstetrics & Gynecology

## 2020-04-29 NOTE — Telephone Encounter (Signed)
Called pt to schedule C/S with [facility]  DOS 7/14 @ 7:30   H&P 7/6 @ 8:40   Covid testing 7/13 @ 9-10, [address], drive up and wear mask. Advised pt to quarantine until DOS.  Pre-admit phone call appointment to be requested - date and time will be included on H&P paper work. Also all appointments will be updated on pt MyChart. Explained that this appointment has a call window. Based on the time scheduled will indicate if the call will be received within a 4 hour window before 1:00 or after.  Advised that pt may also receive calls from the hospital pharmacy and pre-service center.  Confirmed pt has Medicaid as primary insurance. No secondary insurance.  * Requesting that [name] (on call) assist Dr Tiburcio Pea in the OR

## 2020-04-30 NOTE — Telephone Encounter (Signed)
Wednesday CS will now have start times of 830, per anesthesiology.  SO would block schedule accordingly (first pt 1000) and midwife to assist will certainly be there by then.

## 2020-05-04 NOTE — Telephone Encounter (Signed)
Yes- I will be there!

## 2020-05-06 ENCOUNTER — Encounter: Payer: Self-pay | Admitting: Obstetrics & Gynecology

## 2020-05-06 ENCOUNTER — Other Ambulatory Visit: Payer: Self-pay

## 2020-05-06 ENCOUNTER — Ambulatory Visit (INDEPENDENT_AMBULATORY_CARE_PROVIDER_SITE_OTHER): Payer: Medicaid Other | Admitting: Obstetrics & Gynecology

## 2020-05-06 VITALS — BP 120/80 | Wt 221.0 lb

## 2020-05-06 DIAGNOSIS — Z3685 Encounter for antenatal screening for Streptococcus B: Secondary | ICD-10-CM

## 2020-05-06 DIAGNOSIS — Z98891 History of uterine scar from previous surgery: Secondary | ICD-10-CM

## 2020-05-06 DIAGNOSIS — Z3A36 36 weeks gestation of pregnancy: Secondary | ICD-10-CM

## 2020-05-06 DIAGNOSIS — O0993 Supervision of high risk pregnancy, unspecified, third trimester: Secondary | ICD-10-CM

## 2020-05-06 LAB — POCT URINALYSIS DIPSTICK OB: Glucose, UA: NEGATIVE

## 2020-05-06 NOTE — Progress Notes (Signed)
  Subjective  Fetal Movement? yes Contractions? no Leaking Fluid? no Vaginal Bleeding? no  Objective  BP 120/80   Wt 221 lb (100.2 kg)   LMP 08/26/2019 (Exact Date) Comment: normal  BMI 37.93 kg/m  General: NAD Pumonary: no increased work of breathing Abdomen: gravid, non-tender Extremities: no edema Psychiatric: mood appropriate, affect full  Assessment  29 y.o. G2P1001 at [redacted]w[redacted]d by  06/01/2020, by Last Menstrual Period presenting for routine prenatal visit  Plan   Problem List Items Addressed This Visit      Other   High-risk pregnancy, third trimester - Primary   History of cesarean delivery    Other Visit Diagnoses    [redacted] weeks gestation of pregnancy       Relevant Orders   POC Urinalysis Dipstick OB (Completed)   Encounter for antenatal screening for Streptococcus B       Relevant Orders   Culture, beta strep (group b only)    Plans VBAC if labors, CS if no labor by July 14th Breast feeding plans, also may supplement Plans Patches for birth control when done w breast feeding TDaP UTD  Annamarie Major, MD, [facility], [facility] 05/06/2020  2:11 PM

## 2020-05-06 NOTE — Patient Instructions (Signed)
Thank you for choosing [facility]. As part of our ongoing efforts to improve patient experience, we would appreciate your feedback. Please fill out the short survey that you will receive by mail or MyChart. Your opinion is important to [place]! -Dr Kenton Kingfisher   Cesarean Delivery Planned July 14 if no baby prior to then  Cesarean birth, or cesarean delivery, is the surgical delivery of a baby through an incision in the abdomen and the uterus. This may be referred to as a C-section. This procedure may be scheduled ahead of time, or it may be done in an emergency situation. Tell a health care provider about:  Any allergies you have.  All medicines you are taking, including vitamins, herbs, eye drops, creams, and over-the-counter medicines.  Any problems you or family members have had with anesthetic medicines.  Any blood disorders you have.  Any surgeries you have had.  Any medical conditions you have.  Whether you or any members of your family have a history of deep vein thrombosis (DVT) or pulmonary embolism (PE). What are the risks? Generally, this is a safe procedure. However, problems may occur, including:  Infection.  Bleeding.  Allergic reactions to medicines.  Damage to other structures or organs.  Blood clots.  Injury to your baby. What happens before the procedure? General instructions  Follow instructions from your health care provider about eating or drinking restrictions.  If you know that you are going to have a cesarean delivery, do not shave your pubic area. Shaving before the procedure may increase your risk of infection.  Plan to have someone take you home from the hospital.  Ask your health care provider what steps will be taken to prevent infection. These may include: ? Removing hair at the surgery site. ? Washing skin with a germ-killing soap. ? Taking antibiotic medicine.  Depending on the reason for your cesarean delivery, you may have a physical exam  or additional testing, such as an ultrasound.  You may have your blood or urine tested. Questions for your health care provider  Ask your health care provider about: ? Changing or stopping your regular medicines. This is especially important if you are taking diabetes medicines or blood thinners. ? Your pain management plan. This is especially important if you plan to breastfeed your baby. ? How long you will be in the hospital after the procedure. ? Any concerns you may have about receiving blood products, if you need them during the procedure. ? Cord blood banking, if you plan to collect your baby's umbilical cord blood.  You may also want to ask your health care provider: ? Whether you will be able to hold or breastfeed your baby while you are still in the operating room. ? Whether your baby can stay with you immediately after the procedure and during your recovery. ? Whether a family member or a person of your choice can go with you into the operating room and stay with you during the procedure, immediately after the procedure, and during your recovery. What happens during the procedure?   An IV will be inserted into one of your veins.  Fluid and medicines, such as antibiotics, will be given before the surgery.  Fetal monitors will be placed on your abdomen to check your baby's heart rate.  You may be given a special warming gown to wear to keep your temperature stable.  A catheter may be inserted into your bladder through your urethra. This drains your urine during the procedure.  You may be given one or more of the following: ? A medicine to numb the area (local anesthetic). ? A medicine to make you fall asleep (general anesthetic). ? A medicine (regional anesthetic) that is injected into your back or through a small thin tube placed in your back (spinal anesthetic or epidural anesthetic). This numbs everything below the injection site and allows you to stay awake during your  procedure. If this makes you feel nauseous, tell your health care provider. Medicines will be available to help reduce any nausea you may feel.  An incision will be made in your abdomen, and then in your uterus.  If you are awake during your procedure, you may feel tugging and pulling in your abdomen, but you should not feel pain. If you feel pain, tell your health care provider immediately.  Your baby will be removed from your uterus. You may feel more pressure or pushing while this happens.  Immediately after birth, your baby will be dried and kept warm. You may be able to hold and breastfeed your baby.  The umbilical cord may be clamped and cut during this time. This usually occurs after waiting a period of 1-2 minutes after delivery.  Your placenta will be removed from your uterus.  Your incisions will be closed with stitches (sutures). Staples, skin glue, or adhesive strips may also be applied to the incision in your abdomen.  Bandages (dressings) may be placed over the incision in your abdomen. The procedure may vary among health care providers and hospitals. What happens after the procedure?  Your blood pressure, heart rate, breathing rate, and blood oxygen level will be monitored until you are discharged from the hospital.  You may continue to receive fluids and medicines through an IV.  You will have some pain. Medicines will be available to help control your pain.  To help prevent blood clots: ? You may be given medicines. ? You may have to wear compression stockings or devices. ? You will be encouraged to walk around when you are able.  Hospital staff will encourage and support bonding with your baby. Your hospital may have you and your baby to stay in the same room (rooming in) during your hospital stay to encourage successful bonding and breastfeeding.  You may be encouraged to cough and breathe deeply often. This helps to prevent lung problems.  If you have a catheter  draining your urine, it will be removed as soon as possible after your procedure. Summary  Cesarean birth, or cesarean delivery, is the surgical delivery of a baby through an incision in the abdomen and the uterus.  Follow instructions from your health care provider about eating or drinking restrictions before the procedure.  You will have some pain after the procedure. Medicines will be available to help control your pain.  Hospital staff will encourage and support bonding with your baby after the procedure. Your hospital may have you and your baby to stay in the same room (rooming in) during your hospital stay to encourage successful bonding and breastfeeding. This information is not intended to replace advice given to you by your health care provider. Make sure you discuss any questions you have with your health care provider. Document Revised: 05/14/2018 Document Reviewed: 05/14/2018 Elsevier Patient Education  2020 [employer].

## 2020-05-10 LAB — CULTURE, BETA STREP (GROUP B ONLY): Strep Gp B Culture: NEGATIVE

## 2020-05-13 ENCOUNTER — Ambulatory Visit (INDEPENDENT_AMBULATORY_CARE_PROVIDER_SITE_OTHER): Payer: Medicaid Other | Admitting: Certified Nurse Midwife

## 2020-05-13 ENCOUNTER — Other Ambulatory Visit: Payer: Self-pay

## 2020-05-13 VITALS — BP 122/70 | Ht 64.0 in | Wt 220.0 lb

## 2020-05-13 DIAGNOSIS — O0993 Supervision of high risk pregnancy, unspecified, third trimester: Secondary | ICD-10-CM

## 2020-05-13 DIAGNOSIS — Z3A37 37 weeks gestation of pregnancy: Secondary | ICD-10-CM

## 2020-05-13 LAB — FETAL NONSTRESS TEST

## 2020-05-13 LAB — POCT URINALYSIS DIPSTICK OB: Glucose, UA: NEGATIVE

## 2020-05-13 NOTE — Progress Notes (Signed)
ROB at 37wk2d: Doing well. Some irregular BH contractions. No vaginal bleeding or leakage of water. Baby active. GBS was negative. Prior Cesarean section. Desires TOLAC if spontaneous labor. CS scheduled for 7/14 if not in labor.  Exam: BP 120/70. FH 37 cm. TWG 15#. BMI 37.76 kg/m2  FHT baseline 130 with accelerations to 150s to 160s, moderate variability  Cervix: FT/30%/-1/posterior/medium.  A: IUP at 37wk2d Reactive NST  P: NST weekly due to BMI>35 Add NST to scheduled appointment 2 July Labor precautions  Kellie Mejia, CNM

## 2020-05-21 ENCOUNTER — Observation Stay
Admission: EM | Admit: 2020-05-21 | Discharge: 2020-05-22 | Disposition: A | Discharge status: Home / Self Care | Payer: Medicaid Other | Attending: Obstetrics and Gynecology | Admitting: Obstetrics and Gynecology

## 2020-05-21 ENCOUNTER — Other Ambulatory Visit: Payer: Self-pay

## 2020-05-21 ENCOUNTER — Encounter: Payer: Self-pay | Admitting: Obstetrics and Gynecology

## 2020-05-21 DIAGNOSIS — Z8249 Family history of ischemic heart disease and other diseases of the circulatory system: Secondary | ICD-10-CM | POA: Diagnosis not present

## 2020-05-21 DIAGNOSIS — K529 Noninfective gastroenteritis and colitis, unspecified: Secondary | ICD-10-CM | POA: Diagnosis present

## 2020-05-21 DIAGNOSIS — Z79899 Other long term (current) drug therapy: Secondary | ICD-10-CM | POA: Insufficient documentation

## 2020-05-21 DIAGNOSIS — O26893 Other specified pregnancy related conditions, third trimester: Principal | ICD-10-CM | POA: Insufficient documentation

## 2020-05-21 DIAGNOSIS — Z3A38 38 weeks gestation of pregnancy: Secondary | ICD-10-CM | POA: Insufficient documentation

## 2020-05-21 DIAGNOSIS — Z20822 Contact with and (suspected) exposure to covid-19: Secondary | ICD-10-CM | POA: Insufficient documentation

## 2020-05-21 LAB — CBC
HCT: 33.9 % — ABNORMAL LOW (ref 36.0–46.0)
Hemoglobin: 11.9 g/dL — ABNORMAL LOW (ref 12.0–15.0)
MCH: 29.1 pg (ref 26.0–34.0)
MCHC: 35.1 g/dL (ref 30.0–36.0)
MCV: 82.9 fL (ref 80.0–100.0)
Platelets: 252 10*3/uL (ref 150–400)
RBC: 4.09 MIL/uL (ref 3.87–5.11)
RDW: 12.9 % (ref 11.5–15.5)
WBC: 11.1 10*3/uL — ABNORMAL HIGH (ref 4.0–10.5)
nRBC: 0 % (ref 0.0–0.2)

## 2020-05-21 LAB — COMPREHENSIVE METABOLIC PANEL
ALT: 9 U/L (ref 0–44)
AST: 13 U/L — ABNORMAL LOW (ref 15–41)
Albumin: 3 g/dL — ABNORMAL LOW (ref 3.5–5.0)
Alkaline Phosphatase: 98 U/L (ref 38–126)
Anion gap: 10 (ref 5–15)
BUN: 7 mg/dL (ref 6–20)
CO2: 22 mmol/L (ref 22–32)
Calcium: 8.3 mg/dL — ABNORMAL LOW (ref 8.9–10.3)
Chloride: 104 mmol/L (ref 98–111)
Creatinine, Ser: 0.74 mg/dL (ref 0.44–1.00)
GFR calc Af Amer: >60 mL/min (ref >60)
GFR calc non Af Amer: >60 mL/min (ref >60)
Glucose, Bld: 87 mg/dL (ref 70–99)
Potassium: 3.5 mmol/L (ref 3.5–5.1)
Sodium: 136 mmol/L (ref 135–145)
Total Bilirubin: 0.7 mg/dL (ref 0.3–1.2)
Total Protein: 6.4 g/dL — ABNORMAL LOW (ref 6.5–8.1)

## 2020-05-21 LAB — LIPASE, BLOOD: Lipase: 24 U/L (ref 11–51)

## 2020-05-21 MED ORDER — ACETAMINOPHEN 325 MG PO TABS: 650.0000 mg | ORAL_TABLET | ORAL | Status: DC | PRN

## 2020-05-21 MED ORDER — SODIUM CHLORIDE 0.9 % IV BOLUS
1000.0000 mL | Freq: Once | INTRAVENOUS | Status: AC
  Administered 2020-05-21: 1000 mL via INTRAVENOUS

## 2020-05-21 MED ORDER — ONDANSETRON HCL 4 MG/2ML IJ SOLN
INTRAMUSCULAR | Status: AC
  Filled 2020-05-21: qty 2

## 2020-05-21 MED ORDER — ONDANSETRON HCL 4 MG/2ML IJ SOLN
4.0000 mg | Freq: Four times a day (QID) | INTRAMUSCULAR | Status: DC | PRN
  Administered 2020-05-22: 4 mg via INTRAVENOUS
  Filled 2020-05-21: qty 2

## 2020-05-21 MED ORDER — ACETAMINOPHEN 325 MG PO TABS
ORAL_TABLET | ORAL | Status: AC
  Administered 2020-05-21: 650 mg via ORAL
  Filled 2020-05-21: qty 2

## 2020-05-21 NOTE — OB Triage Note (Signed)
Patient came in for observation for vomiting and diarrhea that started yesterday evening at 1615. Patient reports irregular uterine contractions every that she rates 5/10. Patient reports +FM. Patient denies leaking of fluid and denies vaginal bleeding and spotting but reports Enya Bureau vaginal discharge. Vital signs stable and patient afebrile. FHR baseline 115 with moderate variability with accelerations 15 x 15 and no decelerations. Significant other at bedside. Monitors applied and assessing.

## 2020-05-22 ENCOUNTER — Encounter: Payer: Medicaid Other | Admitting: Obstetrics & Gynecology

## 2020-05-22 DIAGNOSIS — O26893 Other specified pregnancy related conditions, third trimester: Secondary | ICD-10-CM | POA: Diagnosis not present

## 2020-05-22 DIAGNOSIS — K529 Noninfective gastroenteritis and colitis, unspecified: Secondary | ICD-10-CM

## 2020-05-22 LAB — SAMPLE TO BLOOD BANK

## 2020-05-22 LAB — URINALYSIS, COMPLETE (UACMP) WITH MICROSCOPIC
Bacteria, UA: NONE SEEN
Glucose, UA: NEGATIVE mg/dL
Ketones, ur: >160 mg/dL — AB
Leukocytes,Ua: NEGATIVE
Nitrite: NEGATIVE
Protein, ur: 100 mg/dL — AB
Specific Gravity, Urine: >1.03 — ABNORMAL HIGH (ref 1.005–1.030)
pH: 6.5 (ref 5.0–8.0)

## 2020-05-22 LAB — SARS CORONAVIRUS 2 BY RT PCR (HOSPITAL ORDER, PERFORMED IN ~~LOC~~ HOSPITAL LAB): SARS Coronavirus 2: NEGATIVE

## 2020-05-22 MED ORDER — CALCIUM CARBONATE ANTACID 500 MG PO CHEW
400.0000 mg | CHEWABLE_TABLET | Freq: Once | ORAL | Status: AC
  Administered 2020-05-22: 400 mg via ORAL

## 2020-05-22 MED ORDER — CALCIUM CARBONATE ANTACID 500 MG PO CHEW
CHEWABLE_TABLET | ORAL | Status: AC
  Filled 2020-05-22: qty 2

## 2020-05-22 MED ORDER — DEXTROSE-NACL 5-0.45 % IV SOLN: INTRAVENOUS | Status: DC

## 2020-05-22 MED ORDER — ONDANSETRON 4 MG PO TBDP
4.0000 mg | ORAL_TABLET | Freq: Four times a day (QID) | ORAL | 0 refills | Status: DC | PRN
Start: 2020-05-22 — End: 2020-06-05

## 2020-05-22 NOTE — Discharge Summary (Signed)
Physician Final Progress Note  Patient ID: Kellie Mejia MRN: [id number] DOB/AGE: October 08, 1991 29 y.o.  Admit date: 05/21/2020 Admitting provider: Vena Austria, MD Discharge date: 05/22/2020   Admission Diagnoses: Gastroenteritis  Discharge Diagnoses:  Active Problems:   Gastroenteritis  29 y.o. G2P1001 at [redacted]w[redacted]d by Estimated Date of Delivery: 06/01/20 presenting with 24-hrs of worsening nausea, vomiting, and diarrhea.  Patient underwent basic GI work up with normal CBC, CMP, and lipase.  She received a 1L NS followed by D5 1/2NS at 143mL/hr and IV Zofran.  She reported improvement in her symptoms overnight on this regimen.  NST was reactive. +FM, no LOF, no VB, irregular contractions  Consults: None  Significant Findings/ Diagnostic Studies: Results for orders placed or performed during the hospital encounter of 05/21/20 (from the past 24 hour(s))  Lipase, blood     Status: None   Collection Time: 05/21/20 10:49 PM  Result Value Ref Range   Lipase 24 11 - 51 U/L  Comprehensive metabolic panel     Status: Abnormal   Collection Time: 05/21/20 10:49 PM  Result Value Ref Range   Sodium 136 135 - 145 mmol/L   Potassium 3.5 3.5 - 5.1 mmol/L   Chloride 104 98 - 111 mmol/L   CO2 22 22 - 32 mmol/L   Glucose, Bld 87 70 - 99 mg/dL   BUN 7 6 - 20 mg/dL   Creatinine, Ser 5.46 0.44 - 1.00 mg/dL   Calcium 8.3 (L) 8.9 - 10.3 mg/dL   Total Protein 6.4 (L) 6.5 - 8.1 g/dL   Albumin 3.0 (L) 3.5 - 5.0 g/dL   AST 13 (L) 15 - 41 U/L   ALT 9 0 - 44 U/L   Alkaline Phosphatase 98 38 - 126 U/L   Total Bilirubin 0.7 0.3 - 1.2 mg/dL   GFR calc non Af Amer >60 >60 mL/min   GFR calc Af Amer >60 >60 mL/min   Anion gap 10 5 - 15  CBC     Status: Abnormal   Collection Time: 05/21/20 10:49 PM  Result Value Ref Range   WBC 11.1 (H) 4.0 - 10.5 K/uL   RBC 4.09 3.87 - 5.11 MIL/uL   Hemoglobin 11.9 (L) 12.0 - 15.0 g/dL   HCT 50.3 (L) 36 - 46 %   MCV 82.9 80.0 - 100.0 fL   MCH 29.1 26.0 - 34.0 pg    MCHC 35.1 30.0 - 36.0 g/dL   RDW 54.6 56.8 - 12.7 %   Platelets 252 150 - 400 K/uL   nRBC 0.0 0.0 - 0.2 %  Urinalysis, Complete w Microscopic     Status: Abnormal   Collection Time: 05/21/20 11:04 PM  Result Value Ref Range   Color, Urine YELLOW YELLOW   APPearance CLEAR CLEAR   Specific Gravity, Urine >1.030 (H) 1.005 - 1.030   pH 6.5 5.0 - 8.0   Glucose, UA NEGATIVE NEGATIVE mg/dL   Hgb urine dipstick SMALL (A) NEGATIVE   Bilirubin Urine SMALL (A) NEGATIVE   Ketones, ur >160 (A) NEGATIVE mg/dL   Protein, ur 517 (A) NEGATIVE mg/dL   Nitrite NEGATIVE NEGATIVE   Leukocytes,Ua NEGATIVE NEGATIVE   Squamous Epithelial / LPF 0-5 0 - 5   WBC, UA 6-10 0 - 5 WBC/hpf   RBC / HPF 11-20 0 - 5 RBC/hpf   Bacteria, UA NONE SEEN NONE SEEN  SARS Coronavirus 2 by RT PCR (hospital order, performed in [facility] hospital lab) Nasopharyngeal Nasopharyngeal Swab     Status:  None   Collection Time: 05/21/20 11:11 PM   Specimen: Nasopharyngeal Swab  Result Value Ref Range   SARS Coronavirus 2 NEGATIVE NEGATIVE     Procedures:  Baseline: 140 Variability: moderate Accelerations: present Decelerations: absent Tocometry: none The patient was monitored for 30 minutes, fetal heart rate tracing was deemed reactive, category I tracing,  Discharge Condition: good  Disposition: Discharge disposition: 01-Home or Self Care       Diet: Regular diet  Discharge Activity: Activity as tolerated  Discharge Instructions    Discharge activity:  No Restrictions   Complete by: As directed    Discharge diet:  No restrictions   Complete by: As directed    Fetal Kick Count:  Lie on our left side for one hour after a meal, and count the number of times your baby kicks.  If it is less than 5 times, get up, move around and drink some juice.  Repeat the test 30 minutes later.  If it is still less than 5 kicks in an hour, notify your doctor.   Complete by: As directed    LABOR:  When conractions begin, you  should start to time them from the beginning of one contraction to the beginning  of the next.  When contractions are 5 - 10 minutes apart or less and have been regular for at least an hour, you should call your health care provider.   Complete by: As directed    No sexual activity restrictions   Complete by: As directed    Notify physician for bleeding from the vagina   Complete by: As directed    Notify physician for blurring of vision or spots before the eyes   Complete by: As directed    Notify physician for chills or fever   Complete by: As directed    Notify physician for fainting spells, "black outs" or loss of consciousness   Complete by: As directed    Notify physician for increase in vaginal discharge   Complete by: As directed    Notify physician for leaking of fluid   Complete by: As directed    Notify physician for pain or burning when urinating   Complete by: As directed    Notify physician for pelvic pressure (sudden increase)   Complete by: As directed    Notify physician for severe or continued nausea or vomiting   Complete by: As directed    Notify physician for sudden gushing of fluid from the vagina (with or without continued leaking)   Complete by: As directed    Notify physician for sudden, constant, or occasional abdominal pain   Complete by: As directed    Notify physician if baby moving less than usual   Complete by: As directed      Allergies as of 05/22/2020   No Known Allergies     Medication List    TAKE these medications   acetaminophen 500 MG tablet Commonly known as: TYLENOL Take 250 mg by mouth 2 (two) times daily as needed (pain.).   multivitamin-prenatal 27-0.8 MG Tabs tablet Take 1 tablet by mouth daily with lunch.   ondansetron 4 MG disintegrating tablet Commonly known as: Zofran ODT Take 1 tablet (4 mg total) by mouth every 6 (six) hours as needed for nausea.        Total time spent taking care of this patient: 45  minutes  Signed: Vena Austria 05/22/2020, 7:48 AM

## 2020-05-22 NOTE — H&P (Signed)
Obstetric H&P   Chief Complaint: Nausea and vomitting  Prenatal Care Provider: [facility]  History of Present Illness: 29 y.o. G2P1001 108w4d by 06/01/2020, by Last Menstrual Period presenting to L&D with 24-hrs of N/V.  She reports po intolerance that has gradually been worsening.  Some loose stools.  No sick contacts.  Does not recall anything unusual.  She did have fairly significant first trimester nausea.  +FM, no LOF, no VB, irregular contractions   Pregravid weight 93 kg Total Weight Gain 6.804 kg  pregnancy2 Problems (from 08/26/19 to present)    Problem Noted Resolved   High-risk pregnancy, third trimester 07/10/2015 by Ala Dach, MD No   Overview Addendum 05/13/2020  4:11 PM by Farrel Conners, CNM     [facility] Prenatal Labs  Dating [place] 6 weeks Blood type: A/Positive/-- (01/06 1459)   Genetic Screen NIPS:nml XX Antibody:Negative (01/06 1459)  Anatomic [place] [facility], nml, mild pelviectasis  Rubella: 4.38 (01/06 1459) Varicella: Imm  GTT Third trimester: nml RPR: Non Reactive (01/06 1459)   Rhogam n/a HBsAg: Negative (01/06 1459)   TDaP vaccine   04/08/20   Flu Shot:no HIV: Non Reactive (01/06 1459)   Baby Food        Will try breast feeding, didn't go well last pregnancy  GBS: negative  Contraception Patch if not breast feeding Pap:11/27/2019  CBB  No   CS/VBAC VBAC desired ([facility] 2016)   Support Person             Previous Version        Review of Systems: 10 point review of systems negative unless otherwise noted in HPI  Past Medical History: Patient Active Problem List   Diagnosis Date Noted  . Gastroenteritis 05/21/2020  . History of cesarean delivery 01/16/2020    OB/GYN  Counseling Note  29 y.o. G2P1001 at [redacted]w[redacted]d with Estimated Date of Delivery: 06/01/20 was seen today in office to discuss trial of labor after cesarean section (TOLAC) versus elective repeat cesarean delivery (ERCD). The following risks were discussed with the patient.  Risk of uterine  rupture at term is 0.78 percent with TOLAC and 0.22 percent with ERCD. 1 in 10 uterine ruptures will result in neonatal death or neurological injury. The benefits of a trial of labor after cesarean (TOLAC) resulting in a vaginal birth after cesarean (VBAC) include the following: shorter length of hospital stay and postpartum recovery (in most cases); fewer complications, such as postpartum fever, wound or uterine infection, thromboembolism (blood clots in the leg or lung), need for blood transfusion and fewer neonatal breathing problems.  The risks of an attempted VBAC or TOLAC include the following: Risk of failed trial of labor after cesarean (TOLAC) without a vaginal birth after cesarean (VBAC) resulting in repeat cesarean delivery (RCD) in about 20 to 40 percent of women who attempt VBAC.  Risk of rupture of uterus resulting in an emergency cesarean delivery. The risk of uterine rupture may be related in part to the type of uterine incision made during the first cesarean delivery. A previous transverse uterine incision has the lowest risk of rupture (0.2 to 1.5 percent risk). Vertical or T-shaped uterine incisions have a higher risk of uterine rupture (4 to 9 percent risk)The risk of fetal death is very low with both VBAC and elective repeat cesarean delivery (ERCD), but the likelihood of fetal death is higher with VBAC than with ERCD. Maternal death is very rare with either type of delivery.  The risks of an  elective repeat cesarean delivery (ERCD) were reviewed with the patient including but not limited to: 12/998 risk of uterine rupture which could have serious consequences, bleeding which may require transfusion; infection which may require antibiotics; injury to bowel, bladder or other surrounding organs (bowel, bladder, ureters); injury to the fetus; need for additional procedures including hysterectomy in the event of a life-threatening hemorrhage; thromboembolic phenomenon; abnormal placentation;  incisional problems; death and other postoperative or anesthesia complications.    Desires TOLAC   . Encounter for supervision of low-risk pregnancy, antepartum 11/27/2019    [facility] Prenatal Labs  Dating [place] 6 weeks Blood type: A/Positive/-- (01/06 1459)   Genetic Screen NIPS:nml XX Antibody:Negative (01/06 1459)  Anatomic [place] [facility], nml, mild pelviectasis  Rubella: 4.38 (01/06 1459) Varicella: Imm  GTT Third trimester:  RPR: Non Reactive (01/06 1459)   Rhogam n/a HBsAg: Negative (01/06 1459)   TDaP vaccine                  Flu Shot: HIV: Non Reactive (01/06 1459)   Baby Food        Will try breast feeding, didn't go well last pregnancy  GBS:   Contraception Patch if not breast feeding Pap:11/27/2019  CBB  No   CS/VBAC VBAC desired ([facility] 2016)   Support Person         . High-risk pregnancy, third trimester 07/10/2015     [facility] Prenatal Labs  Dating [place] 6 weeks Blood type: A/Positive/-- (01/06 1459)   Genetic Screen NIPS:nml XX Antibody:Negative (01/06 1459)  Anatomic [place] [facility], nml, mild pelviectasis  Rubella: 4.38 (01/06 1459) Varicella: Imm  GTT Third trimester: nml RPR: Non Reactive (01/06 1459)   Rhogam n/a HBsAg: Negative (01/06 1459)   TDaP vaccine   04/08/20   Flu Shot:no HIV: Non Reactive (01/06 1459)   Baby Food        Will try breast feeding, didn't go well last pregnancy  GBS: negative  Contraception Patch if not breast feeding Pap:11/27/2019  CBB  No   CS/VBAC VBAC desired ([facility] 2016)   Support Person          . Abdominal pain 07/01/2015    Past Surgical History: Past Surgical History:  Procedure Laterality Date  . CESAREAN SECTION N/A 07/17/2015   Procedure: CESAREAN SECTION;  Surgeon: Suzy Bouchard, MD;  Location: [facility] ORS;  Service: Obstetrics;  Laterality: N/A;  . WISDOM TOOTH EXTRACTION      Past Obstetric History: # 1 - Date: 07/17/15, Sex: Female, Weight: 3391 g, GA: [redacted]w[redacted]d, Delivery: C-Section, Low Transverse, Apgar1: 8, Apgar5: 9,  Living: Living, Birth Comments: None  # 2 - Date: None, Sex: None, Weight: None, GA: None, Delivery: None, Apgar1: None, Apgar5: None, Living: None, Birth Comments: None   Past Gynecologic History:  Family History: Family History  Problem Relation Age of Onset  . Hypertension Maternal Grandfather     Social History: Social History   Socioeconomic History  . Marital status: Single    Spouse name: Not on file  . Number of children: Not on file  . Years of education: Not on file  . Highest education level: Not on file  Occupational History  . Not on file  Tobacco Use  . Smoking status: Never Smoker  . Smokeless tobacco: Never Used  Substance and Sexual Activity  . Alcohol use: Not Currently  . Drug use: Not Currently    Types: Marijuana  . Sexual activity: Yes  Other Topics Concern  .  Not on file  Social History Narrative  . Not on file   Social Determinants of Health   Financial Resource Strain:   . Difficulty of Paying Living Expenses:   Food Insecurity:   . Worried About Programme researcher, broadcasting/film/videounning Out of Food in the Last Year:   . Baristaan Out of Food in the Last Year:   Transportation Needs:   . Freight forwarderLack of Transportation (Medical):   Marland Kitchen. Lack of Transportation (Non-Medical):   Physical Activity:   . Days of Exercise per Week:   . Minutes of Exercise per Session:   Stress:   . Feeling of Stress :   Social Connections:   . Frequency of Communication with Friends and Family:   . Frequency of Social Gatherings with Friends and Family:   . Attends Religious Services:   . Active Member of Clubs or Organizations:   . Attends BankerClub or Organization Meetings:   Marland Kitchen. Marital Status:   Intimate Partner Violence: Unknown  . Fear of Current or Ex-Partner: No  . Emotionally Abused: No  . Physically Abused: No  . Sexually Abused: Not on file    Medications: Prior to Admission medications   Medication Sig Start Date End Date Taking? Authorizing Provider  acetaminophen (TYLENOL) 500 MG tablet Take  250 mg by mouth 2 (two) times daily as needed (pain.).   Yes [provider]  Prenatal Vit-Fe Fumarate-FA (MULTIVITAMIN-PRENATAL) 27-0.8 MG TABS tablet Take 1 tablet by mouth daily with lunch.   Yes [provider]    Allergies: No Known Allergies  Physical Exam: Vitals: Blood pressure 100/61, pulse 89, temperature 98.1 F (36.7 C), temperature source Oral, resp. rate 18, last menstrual period 08/26/2019.  Urine Dip Protein: pending  FHT: 140, moderate, +accels, no decels Toco: q325min  General: NAD HEENT: normocephalic, anicteric Pulmonary: No increased work of breathing Cardiovascular: RRR, distal pulses 2+ Abdomen: Gravid, non-tender Leopolds: vtx Genitourinary: deferred Extremities: no edema, erythema, or tenderness Neurologic: Grossly intact Psychiatric: mood appropriate, affect full  Labs: Results for orders placed or performed during the hospital encounter of 05/21/20 (from the past 24 hour(s))  Lipase, blood     Status: None   Collection Time: 05/21/20 10:49 PM  Result Value Ref Range   Lipase 24 11 - 51 U/L  Comprehensive metabolic panel     Status: Abnormal   Collection Time: 05/21/20 10:49 PM  Result Value Ref Range   Sodium 136 135 - 145 mmol/L   Potassium 3.5 3.5 - 5.1 mmol/L   Chloride 104 98 - 111 mmol/L   CO2 22 22 - 32 mmol/L   Glucose, Bld 87 70 - 99 mg/dL   BUN 7 6 - 20 mg/dL   Creatinine, Ser 1.610.74 0.44 - 1.00 mg/dL   Calcium 8.3 (L) 8.9 - 10.3 mg/dL   Total Protein 6.4 (L) 6.5 - 8.1 g/dL   Albumin 3.0 (L) 3.5 - 5.0 g/dL   AST 13 (L) 15 - 41 U/L   ALT 9 0 - 44 U/L   Alkaline Phosphatase 98 38 - 126 U/L   Total Bilirubin 0.7 0.3 - 1.2 mg/dL   GFR calc non Af Amer >60 >60 mL/min   GFR calc Af Amer >60 >60 mL/min   Anion gap 10 5 - 15  CBC     Status: Abnormal   Collection Time: 05/21/20 10:49 PM  Result Value Ref Range   WBC 11.1 (H) 4.0 - 10.5 K/uL   RBC 4.09 3.87 - 5.11 MIL/uL   Hemoglobin 11.9 (  L) 12.0 - 15.0 g/dL   HCT  23.3 (L) 36 - 46 %   MCV 82.9 80.0 - 100.0 fL   MCH 29.1 26.0 - 34.0 pg   MCHC 35.1 30.0 - 36.0 g/dL   RDW 43.5 68.6 - 16.8 %   Platelets 252 150 - 400 K/uL   nRBC 0.0 0.0 - 0.2 %    Assessment: 29 y.o. G2P1001 [redacted]w[redacted]d by 06/01/2020, by Last Menstrual Period viral gastroenteritis  Plan: 1) Gastroenteritis - CMP, CBC, and lipase normal.   - 1L NS then D5 1/2NS for maintenance - IV Zofran prn - If improvement overnight discharge in AM with po Zofran Rx  2) Fetus - cat I tracing  3) PNL - Blood type A/Positive/-- (01/06 1459) / Anti-bodyscreen Negative (01/06 1459) / Rubella 4.38 (01/06 1459) / Varicella Immune / RPR Non Reactive (04/22 1053) / HBsAg Negative (01/06 1459) / HIV Non Reactive (04/22 1053)  / GBS Negative/-- (06/16 1638)  4) Immunization History -  Immunization History  Administered Date(s) Administered  . Tdap 04/08/2020    5) Disposition - pending improvement in symptosm  Vena Austria, MD, [facility], [facility] 05/22/2020, 12:25 AM

## 2020-05-26 ENCOUNTER — Ambulatory Visit (INDEPENDENT_AMBULATORY_CARE_PROVIDER_SITE_OTHER): Payer: Medicaid Other | Admitting: Obstetrics & Gynecology

## 2020-05-26 ENCOUNTER — Encounter: Payer: Self-pay | Admitting: Obstetrics & Gynecology

## 2020-05-26 ENCOUNTER — Other Ambulatory Visit: Payer: Self-pay

## 2020-05-26 VITALS — BP 120/80 | Ht 64.0 in | Wt 217.0 lb

## 2020-05-26 DIAGNOSIS — O0993 Supervision of high risk pregnancy, unspecified, third trimester: Secondary | ICD-10-CM

## 2020-05-26 DIAGNOSIS — Z3A39 39 weeks gestation of pregnancy: Secondary | ICD-10-CM

## 2020-05-26 DIAGNOSIS — Z98891 History of uterine scar from previous surgery: Secondary | ICD-10-CM

## 2020-05-26 NOTE — H&P (View-Only) (Signed)
PRE-OPERATIVE HISTORY AND PHYSICAL EXAM  HPI:  Kellie Mejia is a 29 y.o. G2P1001.  Patient's last menstrual period was 08/26/2019 (exact date).  [redacted]w[redacted]d Estimated Date of Delivery: 06/01/20  She is being admitted for Elective repeat and she has waited for natural labor to allow for VBAC, yet this has not occured by this date for CS (06/03/20)  PMHx: She  has a past medical history of Anxiety, Asthma, Depression, and GERD (gastroesophageal reflux disease). Also,  has a past surgical history that includes Wisdom tooth extraction and Cesarean section (N/A, 07/17/2015)., family history includes Hypertension in her maternal grandfather.,  reports that she has never smoked. She has never used smokeless tobacco. She reports previous alcohol use. She reports previous drug use. Drug: Marijuana. OB History  Gravida Para Term Preterm AB Living  2 1 1  0 0 1  SAB TAB Ectopic Multiple Live Births  0 0 0 0 1    # Outcome Date GA Lbr Len/2nd Weight Sex Delivery Anes PTL Lv  2 Current           1 Term 07/17/15 [redacted]w[redacted]d  7 lb 7.6 oz (3.391 kg) F CS-LTranv EPI  LIV  Patient denies any other pertinent gynecologic issues. See prenatal record for more complete H&P   Current Outpatient Medications:  .  acetaminophen (TYLENOL) 500 MG tablet, Take 250 mg by mouth 2 (two) times daily as needed (pain.)., Disp: , Rfl:  .  ondansetron (ZOFRAN ODT) 4 MG disintegrating tablet, Take 1 tablet (4 mg total) by mouth every 6 (six) hours as needed for nausea., Disp: 20 tablet, Rfl: 0 .  Prenatal Vit-Fe Fumarate-FA (MULTIVITAMIN-PRENATAL) 27-0.8 MG TABS tablet, Take 1 tablet by mouth daily with lunch., Disp: , Rfl:  Also, has No Known Allergies.  Review of Systems  Constitutional: Positive for malaise/fatigue. Negative for chills and fever.  HENT: Negative for congestion, sinus pain and sore throat.   Eyes: Negative for blurred vision and pain.  Respiratory: Negative for cough and wheezing.   Cardiovascular:  Negative for chest pain and leg swelling.  Gastrointestinal: Negative for abdominal pain, constipation, diarrhea, heartburn, nausea and vomiting.  Genitourinary: Negative for dysuria, frequency, hematuria and urgency.  Musculoskeletal: Negative for back pain, joint pain, myalgias and neck pain.  Skin: Negative for itching and rash.  Neurological: Negative for dizziness, tremors and weakness.  Endo/Heme/Allergies: Does not bruise/bleed easily.  Psychiatric/Behavioral: Negative for depression. The patient is not nervous/anxious and does not have insomnia.     Objective: LMP 08/26/2019 (Exact Date) Comment: normal There were no vitals filed for this visit. Physical Exam Constitutional:      General: She is not in acute distress.    Appearance: She is well-developed.  Genitourinary:     Vulva, urethra, bladder and vagina normal.     Cervix is parous.     Uterus is enlarged.     Genitourinary Comments: Cx closed, 50%, High  HENT:     Head: Normocephalic and atraumatic. No laceration.     Right Ear: Hearing normal.     Left Ear: Hearing normal.     Mouth/Throat:     Pharynx: Uvula midline.  Eyes:     Pupils: Pupils are equal, round, and reactive to light.  Neck:     Thyroid: No thyromegaly.  Cardiovascular:     Rate and Rhythm: Normal rate and regular rhythm.     Heart sounds: No murmur heard.  No friction rub. No gallop.  Pulmonary:     Effort: Pulmonary effort is normal. No respiratory distress.     Breath sounds: Normal breath sounds. No wheezing.  Chest:     Breasts:        Right: No mass, skin change or tenderness.        Left: No mass, skin change or tenderness.  Abdominal:     General: Bowel sounds are normal. There is no distension.     Palpations: Abdomen is soft.     Tenderness: There is no abdominal tenderness. There is no rebound.     Comments: FHT 140s Gravid  Musculoskeletal:        General: Normal range of motion.     Cervical back: Normal range of motion  and neck supple.  Neurological:     Mental Status: She is alert and oriented to person, place, and time.     Cranial Nerves: No cranial nerve deficit.  Skin:    General: Skin is warm and dry.  Psychiatric:        Judgment: Judgment normal.  Vitals reviewed.     Assessment: 1. [redacted] weeks gestation of pregnancy   2. History of cesarean delivery   3. High-risk pregnancy, third trimester     PLAN: 1.  Cesarean Delivery as Scheduled. VBAC option discussed and she has waited for this if natural labor occurred prior to this scheduled date. If cervix changes to a more favorable Bishop score, OK for IOL instead of CS on 06/03/20.  Will plan recheck 06/01/20. Pros and cons of repeat CS vs VBAC, and rosks of surgery in general, discussed.  Patient will undergo surgical management with Cesarean Section.   The risks of surgery were discussed in detail with the patient including but not limited to: bleeding which may require transfusion or reoperation; infection which may require antibiotics; injury to surrounding organs which may involve bowel, bladder, ureters ; need for additional procedures including laparoscopy or laparotomy; thromboembolic phenomenon, surgical site problems and other postoperative/anesthesia complications. Likelihood of success in alleviating the patient's condition was discussed. Routine postoperative instructions will be reviewed with the patient and her family in detail after surgery.  The patient concurred with the proposed plan, giving informed written consent for the surgery.  Patient will be NPO procedure.  Preoperative prophylactic antibiotics, as necessary, and SCDs ordered on call to the OR.  TDaP UTD GBS Neg, A+ Plans to both breast and bottle feed Plans patch for contraception  Annamarie Major, M.D. 05/26/2020 7:35 AM

## 2020-05-26 NOTE — Progress Notes (Signed)
    PRE-OPERATIVE HISTORY AND PHYSICAL EXAM  HPI:  Kellie Mejia is a 29 y.o. G2P1001.  Patient's last menstrual period was 08/26/2019 (exact date).  [redacted]w[redacted]d Estimated Date of Delivery: 06/01/20  She is being admitted for Elective repeat and she has waited for natural labor to allow for VBAC, yet this has not occured by this date for CS (06/03/20)  PMHx: She  has a past medical history of Anxiety, Asthma, Depression, and GERD (gastroesophageal reflux disease). Also,  has a past surgical history that includes Wisdom tooth extraction and Cesarean section (N/A, 07/17/2015)., family history includes Hypertension in her maternal grandfather.,  reports that she has never smoked. She has never used smokeless tobacco. She reports previous alcohol use. She reports previous drug use. Drug: Marijuana. OB History  Gravida Para Term Preterm AB Living  2 1 1 0 0 1  SAB TAB Ectopic Multiple Live Births  0 0 0 0 1    # Outcome Date GA Lbr Len/2nd Weight Sex Delivery Anes PTL Lv  2 Current           1 Term 07/17/15 [redacted]w[redacted]d  7 lb 7.6 oz (3.391 kg) F CS-LTranv EPI  LIV  Patient denies any other pertinent gynecologic issues. See prenatal record for more complete H&P   Current Outpatient Medications:  .  acetaminophen (TYLENOL) 500 MG tablet, Take 250 mg by mouth 2 (two) times daily as needed (pain.)., Disp: , Rfl:  .  ondansetron (ZOFRAN ODT) 4 MG disintegrating tablet, Take 1 tablet (4 mg total) by mouth every 6 (six) hours as needed for nausea., Disp: 20 tablet, Rfl: 0 .  Prenatal Vit-Fe Fumarate-FA (MULTIVITAMIN-PRENATAL) 27-0.8 MG TABS tablet, Take 1 tablet by mouth daily with lunch., Disp: , Rfl:  Also, has No Known Allergies.  Review of Systems  Constitutional: Positive for malaise/fatigue. Negative for chills and fever.  HENT: Negative for congestion, sinus pain and sore throat.   Eyes: Negative for blurred vision and pain.  Respiratory: Negative for cough and wheezing.   Cardiovascular:  Negative for chest pain and leg swelling.  Gastrointestinal: Negative for abdominal pain, constipation, diarrhea, heartburn, nausea and vomiting.  Genitourinary: Negative for dysuria, frequency, hematuria and urgency.  Musculoskeletal: Negative for back pain, joint pain, myalgias and neck pain.  Skin: Negative for itching and rash.  Neurological: Negative for dizziness, tremors and weakness.  Endo/Heme/Allergies: Does not bruise/bleed easily.  Psychiatric/Behavioral: Negative for depression. The patient is not nervous/anxious and does not have insomnia.     Objective: LMP 08/26/2019 (Exact Date) Comment: normal There were no vitals filed for this visit. Physical Exam Constitutional:      General: She is not in acute distress.    Appearance: She is well-developed.  Genitourinary:     Vulva, urethra, bladder and vagina normal.     Cervix is parous.     Uterus is enlarged.     Genitourinary Comments: Cx closed, 50%, High  HENT:     Head: Normocephalic and atraumatic. No laceration.     Right Ear: Hearing normal.     Left Ear: Hearing normal.     Mouth/Throat:     Pharynx: Uvula midline.  Eyes:     Pupils: Pupils are equal, round, and reactive to light.  Neck:     Thyroid: No thyromegaly.  Cardiovascular:     Rate and Rhythm: Normal rate and regular rhythm.     Heart sounds: No murmur heard.  No friction rub. No gallop.     Pulmonary:     Effort: Pulmonary effort is normal. No respiratory distress.     Breath sounds: Normal breath sounds. No wheezing.  Chest:     Breasts:        Right: No mass, skin change or tenderness.        Left: No mass, skin change or tenderness.  Abdominal:     General: Bowel sounds are normal. There is no distension.     Palpations: Abdomen is soft.     Tenderness: There is no abdominal tenderness. There is no rebound.     Comments: FHT 140s Gravid  Musculoskeletal:        General: Normal range of motion.     Cervical back: Normal range of motion  and neck supple.  Neurological:     Mental Status: She is alert and oriented to person, place, and time.     Cranial Nerves: No cranial nerve deficit.  Skin:    General: Skin is warm and dry.  Psychiatric:        Judgment: Judgment normal.  Vitals reviewed.     Assessment: 1. [redacted] weeks gestation of pregnancy   2. History of cesarean delivery   3. High-risk pregnancy, third trimester     PLAN: 1.  Cesarean Delivery as Scheduled. VBAC option discussed and she has waited for this if natural labor occurred prior to this scheduled date. If cervix changes to a more favorable Bishop score, OK for IOL instead of CS on 06/03/20.  Will plan recheck 06/01/20. Pros and cons of repeat CS vs VBAC, and rosks of surgery in general, discussed.  Patient will undergo surgical management with Cesarean Section.   The risks of surgery were discussed in detail with the patient including but not limited to: bleeding which may require transfusion or reoperation; infection which may require antibiotics; injury to surrounding organs which may involve bowel, bladder, ureters ; need for additional procedures including laparoscopy or laparotomy; thromboembolic phenomenon, surgical site problems and other postoperative/anesthesia complications. Likelihood of success in alleviating the patient's condition was discussed. Routine postoperative instructions will be reviewed with the patient and her family in detail after surgery.  The patient concurred with the proposed plan, giving informed written consent for the surgery.  Patient will be NPO procedure.  Preoperative prophylactic antibiotics, as necessary, and SCDs ordered on call to the OR.  TDaP UTD GBS Neg, A+ Plans to both breast and bottle feed Plans patch for contraception  Annamarie Major, M.D. 05/26/2020 7:35 AM

## 2020-05-26 NOTE — Patient Instructions (Signed)
Thank you for choosing [facility]. As part of our ongoing efforts to improve patient experience, we would appreciate your feedback. Please fill out the short survey that you will receive by mail or MyChart. Your opinion is important to us! -Dr Calayah Guadarrama   Cesarean Delivery, Care After This sheet gives you information about how to care for yourself after your procedure. Your health care provider may also give you more specific instructions. If you have problems or questions, contact your health care provider. What can I expect after the procedure? After the procedure, it is common to have:  A small amount of blood or clear fluid coming from the incision.  Some redness, swelling, and pain in your incision area.  Some abdominal pain and soreness.  Vaginal bleeding (lochia). Even though you did not have a vaginal delivery, you will still have vaginal bleeding and discharge.  Pelvic cramps.  Fatigue. You may have pain, swelling, and discomfort in the tissue between your vagina and your anus (perineum) if:  Your C-section was unplanned, and you were allowed to labor and push.  An incision was made in the area (episiotomy) or the tissue tore during attempted vaginal delivery. Follow these instructions at home: Incision care   Follow instructions from your health care provider about how to take care of your incision. Make sure you: ? Wash your hands with soap and water before you change your bandage (dressing). If soap and water are not available, use hand sanitizer. ? If you have a dressing, change it or remove it as told by your health care provider. ? Leave stitches (sutures), skin staples, skin glue, or adhesive strips in place. These skin closures may need to stay in place for 2 weeks or longer. If adhesive strip edges start to loosen and curl up, you may trim the loose edges. Do not remove adhesive strips completely unless your health care provider tells you to do that.  Check your  incision area every day for signs of infection. Check for: ? More redness, swelling, or pain. ? More fluid or blood. ? Warmth. ? Pus or a bad smell.  Do not take baths, swim, or use a hot tub until your health care provider says it's okay. Ask your health care provider if you can take showers.  When you cough or sneeze, hug a pillow. This helps with pain and decreases the chance of your incision opening up (dehiscing). Do this until your incision heals. Medicines  Take over-the-counter and prescription medicines only as told by your health care provider.  If you were prescribed an antibiotic medicine, take it as told by your health care provider. Do not stop taking the antibiotic even if you start to feel better.  Do not drive or use heavy machinery while taking prescription pain medicine. Lifestyle  Do not drink alcohol. This is especially important if you are breastfeeding or taking pain medicine.  Do not use any products that contain nicotine or tobacco, such as cigarettes, e-cigarettes, and chewing tobacco. If you need help quitting, ask your health care provider. Eating and drinking  Drink at least 8 eight-ounce glasses of water every day unless told not to by your health care provider. If you breastfeed, you may need to drink even more water.  Eat high-fiber foods every day. These foods may help prevent or relieve constipation. High-fiber foods include: ? Whole grain cereals and breads. ? Brown rice. ? Beans. ? Fresh fruits and vegetables. Activity   If possible, have someone   help you care for your baby and help with household activities for at least a few days after you leave the hospital.  Return to your normal activities as told by your health care provider. Ask your health care provider what activities are safe for you.  Rest as much as possible. Try to rest or take a nap while your baby is sleeping.  Do not lift anything that is heavier than 10 lbs (4.5 kg), or the  limit that you were told, until your health care provider says that it is safe.  Talk with your health care provider about when you can engage in sexual activity. This may depend on your: ? Risk of infection. ? How fast you heal. ? Comfort and desire to engage in sexual activity. General instructions  Do not use tampons or douches until your health care provider approves.  Wear loose, comfortable clothing and a supportive and well-fitting bra.  Keep your perineum clean and dry. Wipe from front to back when you use the toilet.  If you pass a blood clot, save it and call your health care provider to discuss. Do not flush blood clots down the toilet before you get instructions from your health care provider.  Keep all follow-up visits for you and your baby as told by your health care provider. This is important. Contact a health care provider if:  You have: ? A fever. ? Bad-smelling vaginal discharge. ? Pus or a bad smell coming from your incision. ? Difficulty or pain when urinating. ? A sudden increase or decrease in the frequency of your bowel movements. ? More redness, swelling, or pain around your incision. ? More fluid or blood coming from your incision. ? A rash. ? Nausea. ? Little or no interest in activities you used to enjoy. ? Questions about caring for yourself or your baby.  Your incision feels warm to the touch.  Your breasts turn red or become painful or hard.  You feel unusually sad or worried.  You vomit.  You pass a blood clot from your vagina.  You urinate more than usual.  You are dizzy or light-headed. Get help right away if:  You have: ? Pain that does not go away or get better with medicine. ? Chest pain. ? Difficulty breathing. ? Blurred vision or spots in your vision. ? Thoughts about hurting yourself or your baby. ? New pain in your abdomen or in one of your legs. ? A severe headache.  You faint.  You bleed from your vagina so much that  you fill more than one sanitary pad in one hour. Bleeding should not be heavier than your heaviest period. Summary  After the procedure, it is common to have pain at your incision site, abdominal cramping, and slight bleeding from your vagina.  Check your incision area every day for signs of infection.  Tell your health care provider about any unusual symptoms.  Keep all follow-up visits for you and your baby as told by your health care provider. This information is not intended to replace advice given to you by your health care provider. Make sure you discuss any questions you have with your health care provider. Document Revised: 05/16/2018 Document Reviewed: 05/16/2018 Elsevier Patient Education  2020 [employer]  

## 2020-05-28 ENCOUNTER — Other Ambulatory Visit: Payer: Self-pay

## 2020-05-28 ENCOUNTER — Encounter
Admission: RE | Admit: 2020-05-28 | Discharge: 2020-05-28 | Disposition: A | Discharge status: Home / Self Care | Payer: Medicaid Other | Source: Ambulatory Visit | Attending: Obstetrics & Gynecology | Admitting: Obstetrics & Gynecology

## 2020-05-28 NOTE — Patient Instructions (Signed)
Your procedure is scheduled on:Wed. 7/14 Report to [facility] at 5:45 and call [phone] to be escorted to L&D   Remember: Instructions that are not followed completely may result in serious medical risk,  up to and including death, or upon the discretion of your surgeon and anesthesiologist your  surgery may need to be rescheduled.     _X__ 1. Do not eat food after midnight the night before your procedure.                 No gum chewing or hard candies. You may drink clear liquids up to 2 hours                 before you are scheduled to arrive for your surgery- DO not drink clear                 liquids within 2 hours of the start of your surgery.                 Clear Liquids include:  water, apple juice without pulp, clear Gatorade, G2 or                  Gatorade Zero (avoid Red/Purple/Blue), Black Coffee or Tea (Do not add                 anything to coffee or tea). _____2.   Complete the carbohydrate drink provided to you, 2 hours before arrival.  __X__2.  On the morning of surgery brush your teeth with toothpaste and water, you                may rinse your mouth with mouthwash if you wish.  Do not swallow any toothpaste of mouthwash.     ___ 3.  No Alcohol for 24 hours before or after surgery.   ___ 4.  Do Not Smoke or use e-cigarettes For 24 Hours Prior to Your Surgery.                 Do not use any chewable tobacco products for at least 6 hours prior to                 Surgery.  ___  5.  Do not use any recreational drugs (marijuana, cocaine, heroin, ecstacy, MDMA or other)                For at least one week prior to your surgery.  Combination of these drugs with anesthesia                May have life threatening results.  ____  6.  Bring all medications with you on the day of surgery if instructed.   _x___  7.  Notify your doctor if there is any change in your medical condition      (cold, fever, infections).     Do not wear jewelry,  make-up, hairpins, clips or nail polish. Do not wear lotions, powders, or perfumes. You may wear deodorant. Do not shave 48 hours prior to surgery.  Do not bring valuables to the hospital.    [facility] is not responsible for any belongings or valuables.  Contacts, dentures or bridgework may not be worn into surgery. Leave your suitcase in the car. After surgery it may be brought to your room. For patients admitted to the hospital, discharge time is determined by your treatment team.   Patients discharged the day of surgery will not be  allowed to drive home.   Make arrangements for someone to be with you for the first 24 hours of your Same Day Discharge.    Please read over the following fact sheets that you were given:     ____ Take these medicines the morning of surgery with A SIP OF WATER:    1. none  2.   3.   4.  5.  6.  ____ Fleet Enema (as directed)   __x__ Use CHG Soap (or wipes) as directed  ____ Use Benzoyl Peroxide Gel as instructed  ____ Use inhalers on the day of surgery  ____ Stop metformin 2 days prior to surgery    ____ Take 1/2 of usual insulin dose the night before surgery. No insulin the morning          of surgery.   ____ Stop Coumadin/Plavix/aspirin on   ____ Stop Anti-inflammatories on    ____ Stop supplements until after surgery.    ____ Bring C-Pap to the hospital.

## 2020-06-01 ENCOUNTER — Other Ambulatory Visit: Payer: Self-pay

## 2020-06-01 ENCOUNTER — Encounter: Payer: Self-pay | Admitting: Obstetrics and Gynecology

## 2020-06-01 ENCOUNTER — Ambulatory Visit (INDEPENDENT_AMBULATORY_CARE_PROVIDER_SITE_OTHER): Payer: Medicaid Other | Admitting: Obstetrics and Gynecology

## 2020-06-01 ENCOUNTER — Inpatient Hospital Stay: Admission: RE | Admit: 2020-06-01 | Payer: Medicaid Other | Source: Ambulatory Visit

## 2020-06-01 VITALS — BP 126/78 | Wt 220.0 lb

## 2020-06-01 DIAGNOSIS — Z3A40 40 weeks gestation of pregnancy: Secondary | ICD-10-CM

## 2020-06-01 DIAGNOSIS — O0993 Supervision of high risk pregnancy, unspecified, third trimester: Secondary | ICD-10-CM

## 2020-06-01 DIAGNOSIS — Z98891 History of uterine scar from previous surgery: Secondary | ICD-10-CM

## 2020-06-01 NOTE — Progress Notes (Signed)
  Routine Prenatal Care Visit  Subjective  Kellie Mejia is a 29 y.o. G2P1001 at [redacted]w[redacted]d being seen today for ongoing prenatal care.  She is currently monitored for the following issues for this high-risk pregnancy and has Abdominal pain; High-risk pregnancy, third trimester; Encounter for supervision of low-risk pregnancy, antepartum; History of cesarean delivery; and Gastroenteritis on their problem list.  ----------------------------------------------------------------------------------- Patient reports no complaints.   Contractions: Irregular. Vag. Bleeding: None.  Movement: Present. Leaking Fluid denies.  ----------------------------------------------------------------------------------- The following portions of the patient's history were reviewed and updated as appropriate: allergies, current medications, past family history, past medical history, past social history, past surgical history and problem list. Problem list updated.  Objective  Blood pressure 126/78, weight 220 lb (99.8 kg), last menstrual period 08/26/2019. Pregravid weight 205 lb (93 kg) Total Weight Gain 15 lb (6.804 kg) Urinalysis: Urine Protein    Urine Glucose    Fetal Status: Fetal Heart Rate (bpm): 120 Fundal Height: 40 cm Movement: Present     General:  Alert, oriented and cooperative. Patient is in no acute distress.  Skin: Skin is warm and dry. No rash noted.   Cardiovascular: Normal heart rate noted  Respiratory: Normal respiratory effort, no problems with respiration noted  Abdomen: Soft, gravid, appropriate for gestational age. Pain/Pressure: Absent     Pelvic:  Cervical exam performed Dilation: 1 Effacement (%): 30 Station: -2  Extremities: Normal range of motion.  Edema: None  Mental Status: Normal mood and affect. Normal behavior. Normal judgment and thought content.   Assessment   29 y.o. G2P1001 at [redacted]w[redacted]d by  06/01/2020, by Last Menstrual Period presenting for routine prenatal visit  Plan    pregnancy2 Problems (from 08/26/19 to present)    Problem Noted Resolved   High-risk pregnancy, third trimester 07/10/2015 by Ala Dach, MD No   Overview Addendum 05/13/2020  4:11 PM by Farrel Conners, CNM     Clinic [facility] Prenatal Labs  Dating [place] 6 weeks Blood type: A/Positive/-- (01/06 1459)   Genetic Screen NIPS:nml XX Antibody:Negative (01/06 1459)  Anatomic [place] [facility], nml, mild pelviectasis  Rubella: 4.38 (01/06 1459) Varicella: Imm  GTT Third trimester: nml RPR: Non Reactive (01/06 1459)   Rhogam n/a HBsAg: Negative (01/06 1459)   TDaP vaccine   04/08/20   Flu Shot:no HIV: Non Reactive (01/06 1459)   Baby Food        Will try breast feeding, didn't go well last pregnancy  GBS: negative  Contraception Patch if not breast feeding Pap:11/27/2019  CBB  No   CS/VBAC VBAC desired ([facility] 2016)   Support Person          Previous Version       Term labor symptoms and general obstetric precautions including but not limited to vaginal bleeding, contractions, leaking of fluid and fetal movement were reviewed in detail with the patient. Please refer to After Visit Summary for other counseling recommendations.   Return if symptoms worsen or fail to improve.  C-section in 2 days, if no labor prior to this time.   Thomasene Mohair, MD, [facility], [facility] 06/01/2020 9:13 AM

## 2020-06-02 ENCOUNTER — Other Ambulatory Visit: Payer: Medicaid Other

## 2020-06-02 ENCOUNTER — Other Ambulatory Visit
Admission: RE | Admit: 2020-06-02 | Discharge: 2020-06-02 | Disposition: A | Discharge status: Home / Self Care | Payer: Medicaid Other | Source: Ambulatory Visit | Attending: Obstetrics & Gynecology | Admitting: Obstetrics & Gynecology

## 2020-06-02 LAB — TYPE AND SCREEN
ABO/RH(D): A POS
Antibody Screen: NEGATIVE
Extend sample reason: UNDETERMINED

## 2020-06-02 LAB — CBC
HCT: 34.6 % — ABNORMAL LOW (ref 36.0–46.0)
Hemoglobin: 11.5 g/dL — ABNORMAL LOW (ref 12.0–15.0)
MCH: 27.7 pg (ref 26.0–34.0)
MCHC: 33.2 g/dL (ref 30.0–36.0)
MCV: 83.4 fL (ref 80.0–100.0)
Platelets: 275 10*3/uL (ref 150–400)
RBC: 4.15 MIL/uL (ref 3.87–5.11)
RDW: 13.1 % (ref 11.5–15.5)
WBC: 9.3 10*3/uL (ref 4.0–10.5)
nRBC: 0 % (ref 0.0–0.2)

## 2020-06-02 LAB — SARS CORONAVIRUS 2 (TAT 6-24 HRS): SARS Coronavirus 2: NEGATIVE

## 2020-06-02 MED ORDER — SODIUM CHLORIDE 0.9 % IV SOLN
2.0000 g | INTRAVENOUS | Status: AC
  Administered 2020-06-03: 2 g via INTRAVENOUS
  Filled 2020-06-02: qty 2

## 2020-06-03 ENCOUNTER — Inpatient Hospital Stay: Payer: Medicaid Other | Admitting: Anesthesiology

## 2020-06-03 ENCOUNTER — Other Ambulatory Visit: Payer: Self-pay

## 2020-06-03 ENCOUNTER — Inpatient Hospital Stay
Admission: RE | Admit: 2020-06-03 | Discharge: 2020-06-05 | DRG: 788 | Disposition: A | Discharge status: Home / Self Care | Payer: Medicaid Other | Attending: Obstetrics & Gynecology | Admitting: Obstetrics & Gynecology

## 2020-06-03 ENCOUNTER — Encounter
Admission: RE | Disposition: A | Discharge status: Home / Self Care | Payer: Self-pay | Source: Home / Self Care | Attending: Obstetrics & Gynecology

## 2020-06-03 ENCOUNTER — Encounter: Payer: Self-pay | Admitting: Obstetrics & Gynecology

## 2020-06-03 DIAGNOSIS — Z98891 History of uterine scar from previous surgery: Secondary | ICD-10-CM

## 2020-06-03 DIAGNOSIS — O34211 Maternal care for low transverse scar from previous cesarean delivery: Secondary | ICD-10-CM | POA: Diagnosis present

## 2020-06-03 DIAGNOSIS — Z3A40 40 weeks gestation of pregnancy: Secondary | ICD-10-CM | POA: Diagnosis not present

## 2020-06-03 DIAGNOSIS — Z20822 Contact with and (suspected) exposure to covid-19: Secondary | ICD-10-CM | POA: Diagnosis present

## 2020-06-03 DIAGNOSIS — O3429 Maternal care due to uterine scar from other previous surgery: Secondary | ICD-10-CM | POA: Diagnosis not present

## 2020-06-03 LAB — ABO/RH: ABO/RH(D): A POS

## 2020-06-03 SURGERY — Surgical Case
Anesthesia: Spinal

## 2020-06-03 MED ORDER — BUPIVACAINE HCL (PF) 0.5 % IJ SOLN
INTRAMUSCULAR | Status: AC
  Filled 2020-06-03: qty 30

## 2020-06-03 MED ORDER — NALOXONE HCL 4 MG/10ML IJ SOLN
1.0000 ug/kg/h | INTRAVENOUS | Status: DC | PRN
  Filled 2020-06-03: qty 5

## 2020-06-03 MED ORDER — SODIUM CHLORIDE 0.9 % IV SOLN
2.5000 [IU]/h | INTRAVENOUS | Status: AC
  Filled 2020-06-03: qty 3

## 2020-06-03 MED ORDER — IBUPROFEN 800 MG PO TABS
800.0000 mg | ORAL_TABLET | Freq: Four times a day (QID) | ORAL | Status: DC
  Administered 2020-06-04 – 2020-06-05 (×5): 800 mg via ORAL
  Filled 2020-06-03 (×5): qty 1

## 2020-06-03 MED ORDER — PHENYLEPHRINE HCL (PRESSORS) 10 MG/ML IV SOLN
INTRAVENOUS | Status: DC | PRN
  Administered 2020-06-03: 100 ug via INTRAVENOUS

## 2020-06-03 MED ORDER — ZOLPIDEM TARTRATE 5 MG PO TABS: 5.0000 mg | ORAL_TABLET | Freq: Every evening | ORAL | Status: DC | PRN

## 2020-06-03 MED ORDER — OXYTOCIN-SODIUM CHLORIDE 30-0.9 UT/500ML-% IV SOLN
INTRAVENOUS | Status: AC
  Filled 2020-06-03: qty 500

## 2020-06-03 MED ORDER — SIMETHICONE 80 MG PO CHEW
80.0000 mg | CHEWABLE_TABLET | ORAL | Status: DC
  Administered 2020-06-04 (×2): 80 mg via ORAL
  Filled 2020-06-03 (×2): qty 1

## 2020-06-03 MED ORDER — NALBUPHINE HCL 10 MG/ML IJ SOLN: 5.0000 mg | INTRAMUSCULAR | Status: DC | PRN

## 2020-06-03 MED ORDER — LACTATED RINGERS IV SOLN: INTRAVENOUS | Status: DC

## 2020-06-03 MED ORDER — OXYCODONE-ACETAMINOPHEN 5-325 MG PO TABS
1.0000 | ORAL_TABLET | ORAL | Status: DC | PRN
  Administered 2020-06-03 – 2020-06-05 (×4): 1 via ORAL
  Filled 2020-06-03 (×5): qty 1

## 2020-06-03 MED ORDER — ACETAMINOPHEN 500 MG PO TABS
1000.0000 mg | ORAL_TABLET | Freq: Four times a day (QID) | ORAL | Status: AC
  Administered 2020-06-03 – 2020-06-04 (×3): 1000 mg via ORAL
  Filled 2020-06-03 (×3): qty 2

## 2020-06-03 MED ORDER — ONDANSETRON HCL 4 MG/2ML IJ SOLN
INTRAMUSCULAR | Status: DC | PRN
  Administered 2020-06-03: 4 mg via INTRAVENOUS

## 2020-06-03 MED ORDER — DIPHENHYDRAMINE HCL 25 MG PO CAPS: 25.0000 mg | ORAL_CAPSULE | ORAL | Status: DC | PRN

## 2020-06-03 MED ORDER — SOD CITRATE-CITRIC ACID 500-334 MG/5ML PO SOLN
30.0000 mL | ORAL | Status: AC
  Administered 2020-06-03: 30 mL via ORAL
  Filled 2020-06-03: qty 30

## 2020-06-03 MED ORDER — SODIUM CHLORIDE 0.9% FLUSH: 3.0000 mL | INTRAVENOUS | Status: DC | PRN

## 2020-06-03 MED ORDER — PHENYLEPHRINE HCL (PRESSORS) 10 MG/ML IV SOLN
INTRAVENOUS | Status: AC
  Filled 2020-06-03: qty 1

## 2020-06-03 MED ORDER — KETOROLAC TROMETHAMINE 30 MG/ML IJ SOLN: 30.0000 mg | Freq: Four times a day (QID) | INTRAMUSCULAR | Status: DC

## 2020-06-03 MED ORDER — ONDANSETRON HCL 4 MG/2ML IJ SOLN: 4.0000 mg | Freq: Three times a day (TID) | INTRAMUSCULAR | Status: DC | PRN

## 2020-06-03 MED ORDER — OXYTOCIN-SODIUM CHLORIDE 30-0.9 UT/500ML-% IV SOLN
INTRAVENOUS | Status: DC | PRN
Start: 2020-06-03 — End: 2020-06-03
  Administered 2020-06-03: 250 mL via INTRAVENOUS

## 2020-06-03 MED ORDER — FENTANYL CITRATE (PF) 100 MCG/2ML IJ SOLN
INTRAMUSCULAR | Status: DC | PRN
  Administered 2020-06-03: 15 ug via INTRAVENOUS

## 2020-06-03 MED ORDER — DIBUCAINE (PERIANAL) 1 % EX OINT: 1.0000 "application " | TOPICAL_OINTMENT | CUTANEOUS | Status: DC | PRN

## 2020-06-03 MED ORDER — SENNOSIDES-DOCUSATE SODIUM 8.6-50 MG PO TABS
2.0000 | ORAL_TABLET | ORAL | Status: DC
  Administered 2020-06-04 (×2): 2 via ORAL
  Filled 2020-06-03 (×2): qty 2

## 2020-06-03 MED ORDER — SIMETHICONE 80 MG PO CHEW
80.0000 mg | CHEWABLE_TABLET | Freq: Three times a day (TID) | ORAL | Status: DC
  Administered 2020-06-03 – 2020-06-05 (×6): 80 mg via ORAL
  Filled 2020-06-03 (×6): qty 1

## 2020-06-03 MED ORDER — KETOROLAC TROMETHAMINE 30 MG/ML IJ SOLN
30.0000 mg | Freq: Four times a day (QID) | INTRAMUSCULAR | Status: AC
  Administered 2020-06-03 – 2020-06-04 (×4): 30 mg via INTRAVENOUS
  Filled 2020-06-03 (×4): qty 1

## 2020-06-03 MED ORDER — BUPIVACAINE 0.25 % ON-Q PUMP DUAL CATH 400 ML
400.0000 mL | INJECTION | Status: DC
  Filled 2020-06-03: qty 400

## 2020-06-03 MED ORDER — MORPHINE SULFATE (PF) 0.5 MG/ML IJ SOLN
INTRAMUSCULAR | Status: DC | PRN
  Administered 2020-06-03: 100 ug via EPIDURAL

## 2020-06-03 MED ORDER — ONDANSETRON HCL 4 MG/2ML IJ SOLN
4.0000 mg | INTRAMUSCULAR | Status: DC | PRN
  Administered 2020-06-03: 4 mg via INTRAVENOUS
  Filled 2020-06-03: qty 2

## 2020-06-03 MED ORDER — CHLORHEXIDINE GLUCONATE 0.12 % MT SOLN
15.0000 mL | Freq: Once | OROMUCOSAL | Status: AC
  Administered 2020-06-03: 15 mL via OROMUCOSAL
  Filled 2020-06-03 (×2): qty 15

## 2020-06-03 MED ORDER — SODIUM CHLORIDE 0.9 % IV SOLN
INTRAVENOUS | Status: DC | PRN
  Administered 2020-06-03: 25 ug/min via INTRAVENOUS

## 2020-06-03 MED ORDER — BUPIVACAINE ON-Q PAIN PUMP (FOR ORDER SET NO CHG)
INJECTION | Status: DC
  Filled 2020-06-03: qty 1

## 2020-06-03 MED ORDER — POVIDONE-IODINE 10 % EX SWAB: 2.0000 "application " | Freq: Once | CUTANEOUS | Status: DC

## 2020-06-03 MED ORDER — PRENATAL MULTIVITAMIN CH
1.0000 | ORAL_TABLET | Freq: Every day | ORAL | Status: DC
  Administered 2020-06-04 – 2020-06-05 (×2): 1 via ORAL
  Filled 2020-06-03 (×2): qty 1

## 2020-06-03 MED ORDER — ONDANSETRON HCL 4 MG/2ML IJ SOLN
INTRAMUSCULAR | Status: AC
  Filled 2020-06-03: qty 2

## 2020-06-03 MED ORDER — DIPHENHYDRAMINE HCL 25 MG PO CAPS: 25.0000 mg | ORAL_CAPSULE | Freq: Four times a day (QID) | ORAL | Status: DC | PRN

## 2020-06-03 MED ORDER — MEPERIDINE HCL 25 MG/ML IJ SOLN: 6.2500 mg | INTRAMUSCULAR | Status: DC | PRN

## 2020-06-03 MED ORDER — DIPHENHYDRAMINE HCL 50 MG/ML IJ SOLN: 12.5000 mg | INTRAMUSCULAR | Status: DC | PRN

## 2020-06-03 MED ORDER — MENTHOL 3 MG MT LOZG
1.0000 | LOZENGE | OROMUCOSAL | Status: DC | PRN
  Filled 2020-06-03: qty 9

## 2020-06-03 MED ORDER — NALOXONE HCL 0.4 MG/ML IJ SOLN: 0.4000 mg | INTRAMUSCULAR | Status: DC | PRN

## 2020-06-03 MED ORDER — ORAL CARE MOUTH RINSE: 15.0000 mL | Freq: Once | OROMUCOSAL | Status: AC

## 2020-06-03 MED ORDER — FENTANYL CITRATE (PF) 100 MCG/2ML IJ SOLN
INTRAMUSCULAR | Status: AC
  Filled 2020-06-03: qty 2

## 2020-06-03 MED ORDER — BUPIVACAINE HCL 0.5 % IJ SOLN
10.0000 mL | Freq: Once | INTRAMUSCULAR | Status: DC
  Filled 2020-06-03: qty 10

## 2020-06-03 MED ORDER — COCONUT OIL OIL
1.0000 "application " | TOPICAL_OIL | Status: DC | PRN
  Filled 2020-06-03: qty 120

## 2020-06-03 MED ORDER — ACETAMINOPHEN 325 MG PO TABS: 650.0000 mg | ORAL_TABLET | ORAL | Status: DC | PRN

## 2020-06-03 MED ORDER — NALBUPHINE HCL 10 MG/ML IJ SOLN: 5.0000 mg | Freq: Once | INTRAMUSCULAR | Status: DC | PRN

## 2020-06-03 MED ORDER — WITCH HAZEL-GLYCERIN EX PADS: 1.0000 "application " | MEDICATED_PAD | CUTANEOUS | Status: DC | PRN

## 2020-06-03 MED ORDER — BUPIVACAINE HCL (PF) 0.5 % IJ SOLN
INTRAMUSCULAR | Status: DC | PRN
  Administered 2020-06-03: 10 mL

## 2020-06-03 MED ORDER — SCOPOLAMINE 1 MG/3DAYS TD PT72
1.0000 | MEDICATED_PATCH | Freq: Once | TRANSDERMAL | Status: DC
  Administered 2020-06-03: 1.5 mg via TRANSDERMAL
  Filled 2020-06-03: qty 1

## 2020-06-03 MED ORDER — SIMETHICONE 80 MG PO CHEW: 80.0000 mg | CHEWABLE_TABLET | ORAL | Status: DC | PRN

## 2020-06-03 MED ORDER — BUPIVACAINE IN DEXTROSE 0.75-8.25 % IT SOLN
INTRATHECAL | Status: DC | PRN
  Administered 2020-06-03: 1.6 mL via INTRATHECAL

## 2020-06-03 MED ORDER — MORPHINE SULFATE (PF) 0.5 MG/ML IJ SOLN
INTRAMUSCULAR | Status: AC
  Filled 2020-06-03: qty 10

## 2020-06-03 MED ORDER — MORPHINE SULFATE (PF) 2 MG/ML IV SOLN: 1.0000 mg | INTRAVENOUS | Status: DC | PRN

## 2020-06-03 MED ORDER — LACTATED RINGERS IV SOLN: Freq: Once | INTRAVENOUS | Status: AC

## 2020-06-03 SURGICAL SUPPLY — 27 items
CANISTER SUCT 3000ML PPV (MISCELLANEOUS) ×3 IMPLANT
CATH KIT ON-Q SILVERSOAK 5IN (CATHETERS) ×6 IMPLANT
CHLORAPREP W/TINT 26 (MISCELLANEOUS) ×6 IMPLANT
COVER WAND RF STERILE (DRAPES) ×3 IMPLANT
DERMABOND ADHESIVE PROPEN (GAUZE/BANDAGES/DRESSINGS) ×2
DERMABOND ADVANCED (GAUZE/BANDAGES/DRESSINGS) ×2
DERMABOND ADVANCED .7 DNX12 (GAUZE/BANDAGES/DRESSINGS) ×1 IMPLANT
DERMABOND ADVANCED .7 DNX6 (GAUZE/BANDAGES/DRESSINGS) ×1 IMPLANT
ELECT CAUTERY BLADE 6.4 (BLADE) ×3 IMPLANT
ELECT REM PT RETURN 9FT ADLT (ELECTROSURGICAL) ×3
ELECTRODE REM PT RTRN 9FT ADLT (ELECTROSURGICAL) ×1 IMPLANT
GLOVE SKINSENSE NS SZ8.0 LF (GLOVE) ×2
GLOVE SKINSENSE STRL SZ8.0 LF (GLOVE) ×1 IMPLANT
GOWN STRL REUS W/ TWL LRG LVL3 (GOWN DISPOSABLE) ×1 IMPLANT
GOWN STRL REUS W/ TWL XL LVL3 (GOWN DISPOSABLE) ×2 IMPLANT
GOWN STRL REUS W/TWL LRG LVL3 (GOWN DISPOSABLE) ×2
GOWN STRL REUS W/TWL XL LVL3 (GOWN DISPOSABLE) ×4
NS IRRIG 1000ML POUR BTL (IV SOLUTION) ×3 IMPLANT
PACK C SECTION (MISCELLANEOUS) ×3 IMPLANT
PAD OB MATERNITY 4.3X12.25 (PERSONAL CARE ITEMS) ×3 IMPLANT
PAD PREP 24X41 OB/GYN DISP (PERSONAL CARE ITEMS) ×3 IMPLANT
PENCIL SMOKE ULTRAEVAC 22 CON (MISCELLANEOUS) ×3 IMPLANT
SUT MAXON ABS #0 GS21 30IN (SUTURE) ×6 IMPLANT
SUT VIC AB 1 CT1 36 (SUTURE) ×9 IMPLANT
SUT VIC AB 2-0 CT1 36 (SUTURE) ×3 IMPLANT
SUT VIC AB 4-0 FS2 27 (SUTURE) ×3 IMPLANT
TAPE STRIPS DRAPE STRL (GAUZE/BANDAGES/DRESSINGS) ×3 IMPLANT

## 2020-06-03 NOTE — Lactation Note (Signed)
This note was copied from a baby's chart. Lactation Consultation Note  Patient Name: Kellie Mejia [id number] Date: 06/03/2020 Reason for consult: Initial assessment;Term;Other (Comment) (c-section delivery)  Lactation in to see mom and baby [place]. Mom is G2P2, repeat c-section delivery. Mom did not breastfeed first baby (IOL, c-section, delayed lactogenesis), but desires to begin breastfeeding journey in hospital with [facility]. Mom has breastfed since delivery, and reports no pain/discomfort, L&D RN assisted with positioning, latch, and early education.  Baby now skin to skin with mom after most recent breastfeeding attempt, asleep/content on mom's chest. LC reviewed newborn stomach size, feeding behaviors for HOL, and output expectations. Provided examples of early cues vs late cues, feeding with cues recommendations, position of baby and sandwiching of tissue for optimal deep latch, and lactation support while in the hospital. Encouraged mom to rest when able, reviewed benefits of skin to skin with both mom and dad, and to call with questions or for assistance. LC name/number updated on whiteboard.  Maternal Data Formula Feeding for Exclusion: No Has patient been taught Hand Expression?: Yes Does the patient have breastfeeding experience prior to this delivery?: No  Feeding Feeding Type: Breast Fed  LATCH Score                   Interventions Interventions: Breast feeding basics reviewed  Lactation Tools Discussed/Used     Consult Status Consult Status: Follow-up Date: 06/03/20 Follow-up type: In-patient    Danford Bad 06/03/2020, 11:50 AM

## 2020-06-03 NOTE — Anesthesia Procedure Notes (Signed)
Spinal  Patient location during procedure: OR Start time: 06/03/2020 7:37 AM End time: 06/03/2020 7:45 AM Staffing Performed: resident/CRNA  Anesthesiologist: Tera Mater, MD Resident/CRNA: Johnna Acosta, CRNA Preanesthetic Checklist Completed: patient identified, IV checked, site marked, risks and benefits discussed, surgical consent, monitors and equipment checked, pre-op evaluation and timeout performed Spinal Block Patient position: sitting Prep: ChloraPrep Patient monitoring: heart rate, continuous pulse ox, blood pressure and cardiac monitor Approach: midline Location: L3-4 Injection technique: single-shot Needle Needle type: Whitacre and Introducer  Needle gauge: 24 G Needle length: 9 cm Assessment Sensory level: T10 Additional Notes Negative paresthesia. Negative blood return. Positive free-flowing CSF. Expiration date of kit checked and confirmed. Patient tolerated procedure well, without complications.

## 2020-06-03 NOTE — Anesthesia Preprocedure Evaluation (Addendum)
Anesthesia Evaluation  Patient identified by MRN, date of birth, ID band Patient awake    Reviewed: Allergy & Precautions, H&P , NPO status , Patient's Chart, lab work & pertinent test results  Airway Mallampati: II  TM Distance: >3 FB Neck ROM: full    Dental  (+) Teeth Intact   Pulmonary asthma ,    breath sounds clear to auscultation       Cardiovascular Exercise Tolerance: Good (-) hypertensionnegative cardio ROS   Rhythm:regular Rate:Normal     Neuro/Psych PSYCHIATRIC DISORDERS Anxiety Depression    GI/Hepatic GERD  ,  Endo/Other    Renal/GU   negative genitourinary   Musculoskeletal   Abdominal   Peds  Hematology negative hematology ROS (+)   Anesthesia Other Findings Past Medical History: No date: Anxiety No date: Asthma     Comment:  excercise induced No date: Depression No date: GERD (gastroesophageal reflux disease)  Past Surgical History: 07/17/2015: CESAREAN SECTION; N/A     Comment:  Procedure: CESAREAN SECTION;  Surgeon: Suzy Bouchard, MD;  Location: [facility] ORS;  Service:               Obstetrics;  Laterality: N/A; No date: WISDOM TOOTH EXTRACTION  BMI    Body Mass Index: 37.46 kg/m      Reproductive/Obstetrics (+) Pregnancy                            Anesthesia Physical Anesthesia Plan  ASA: II  Anesthesia Plan: Spinal   Post-op Pain Management:    Induction:   PONV Risk Score and Plan:   Airway Management Planned:   Additional Equipment:   Intra-op Plan:   Post-operative Plan:   Informed Consent: I have reviewed the patients History and Physical, chart, labs and discussed the procedure including the risks, benefits and alternatives for the proposed anesthesia with the patient or authorized representative who has indicated his/her understanding and acceptance.     Dental Advisory Given  Plan Discussed with:  Anesthesiologist  Anesthesia Plan Comments:         Anesthesia Quick Evaluation

## 2020-06-03 NOTE — Transfer of Care (Signed)
Immediate Anesthesia Transfer of Care Note  Patient: Kellie Mejia  Procedure(s) Performed: CESAREAN SECTION (N/A )  Patient Location: PACU  Anesthesia Type:Spinal  Level of Consciousness: awake, alert  and oriented  Airway & Oxygen Therapy: Patient Spontanous Breathing  Post-op Assessment: Report given to RN and Post -op Vital signs reviewed and stable  Post vital signs: Reviewed and stable  Last Vitals:  Vitals Value Taken Time  BP 112/54 06/03/20 0839  Temp 36.4 C 06/03/20 0839  Pulse 79 06/03/20 0839  Resp 21 06/03/20 0839  SpO2 99 % 06/03/20 0839    Last Pain:  Vitals:   06/03/20 0839  TempSrc: Temporal         Complications: No complications documented.

## 2020-06-03 NOTE — Discharge Summary (Signed)
Postpartum Discharge Summary  Date of Service updated 06/05/2020     Patient Name: Kellie Mejia DOB: 1991/02/24 MRN: [id number]  Date of admission: 06/03/2020 Delivery date:06/03/2020  Delivering provider: Gae Dry  Date of discharge: 06/05/2020  Admitting diagnosis: Prior cesarean section/ Desires repeat Intrauterine pregnancy: [redacted]w[redacted]d    Secondary diagnosis:  Active Problems:    Additional problems: None    Discharge diagnosis: Term Pregnancy Delivered and Prior Cesarean Section     Delivery by cesarean section using transverse incision of lower segment of uterus  Postpartum care following cesarean delivery                                      Post partum procedures:none Augmentation: N/A Complications: None  Hospital course: Sceduled C/S   29y.o. yo G2P1001 at 417w2das admitted to the hospital 06/03/2020 for scheduled cesarean section with the following indication:Elective Repeat.Delivery details are as follows:  Membrane Rupture Time/Date: 7:57 AM ,06/03/2020   Delivery Method:C-Section, Low Transverse  Details of operation can be found in separate operative note.  Patient had an uncomplicated postpartum course.  She is ambulating, tolerating a regular diet, passing flatus, and urinating well. Patient is discharged home in stable condition on  06/05/20        Newborn Data: Birth date:06/03/2020  Birth time:7:57 AM  Gender:Female [place] Living status:Living  Apgars:8 ,9  Weight:3640 g     Magnesium Sulfate received: No BMZ received: No Rhophylac:No MMR:No T-DaP:Given prenatally Flu: Yes Transfusion:No  Physical exam  Vitals:   06/04/20 0900 06/04/20 1600 06/04/20 2325 06/05/20 0757  BP:  110/73 107/64 110/75  Pulse: 69 66 69 70  Resp:  18 18 20   Temp:  98.7 F (37.1 C) 98.2 F (36.8 C) 98.4 F (36.9 C)  TempSrc:  Oral Oral Oral  SpO2: 95%  100% 100%  Weight:      Height:       General: alert, cooperative and no distress Lochia:  appropriate Uterine Fundus: firm/ U-2/ML/NT Incision: Dressing is clean, dry, and intact DVT Evaluation: No evidence of DVT seen on physical exam. Labs: Lab Results  Component Value Date   WBC 12.9 (H) 06/04/2020   HGB 11.2 (L) 06/04/2020   HCT 32.6 (L) 06/04/2020   MCV 83.2 06/04/2020   PLT 256 06/04/2020   CMP Latest Ref Rng & Units 05/21/2020  Glucose 70 - 99 mg/dL 87  BUN 6 - 20 mg/dL 7  Creatinine 0.44 - 1.00 mg/dL 0.74  Sodium 135 - 145 mmol/L 136  Potassium 3.5 - 5.1 mmol/L 3.5  Chloride 98 - 111 mmol/L 104  CO2 22 - 32 mmol/L 22  Calcium 8.9 - 10.3 mg/dL 8.3(L)  Total Protein 6.5 - 8.1 g/dL 6.4(L)  Total Bilirubin 0.3 - 1.2 mg/dL 0.7  Alkaline Phos 38 - 126 U/L 98  AST 15 - 41 U/L 13(L)  ALT 0 - 44 U/L 9   Edinburgh Score: Edinburgh Postnatal Depression Scale Screening Tool 06/03/2020  I have been able to laugh and see the funny side of things. 0  I have looked forward with enjoyment to things. 0  I have blamed myself unnecessarily when things went wrong. 0  I have been anxious or worried for no good reason. 1  I have felt scared or panicky for no good reason. 0  Things have been getting on top of me. 1  I have been so unhappy that I have had difficulty sleeping. 0  I have felt sad or miserable. 0  I have been so unhappy that I have been crying. 0  The thought of harming myself has occurred to me. 0  Edinburgh Postnatal Depression Scale Total 2      After visit meds:  Allergies as of 06/05/2020   No Known Allergies     Medication List    STOP taking these medications   acetaminophen 500 MG tablet Commonly known as: TYLENOL   ondansetron 4 MG disintegrating tablet Commonly known as: Zofran ODT     TAKE these medications   ibuprofen 800 MG tablet Commonly known as: ADVIL Take 1 tablet (800 mg total) by mouth every 6 (six) hours.   multivitamin-prenatal 27-0.8 MG Tabs tablet Take 1 tablet by mouth daily with lunch.   oxyCODONE-acetaminophen 5-325  MG tablet Commonly known as: PERCOCET/ROXICET Take 1-2 tablets by mouth every 6 (six) hours as needed for up to 7 days for moderate pain or severe pain.   senna-docusate 8.6-50 MG tablet Commonly known as: Senokot-S Take 2 tablets by mouth at bedtime as needed for mild constipation.        Discharge home in stable condition Infant Feeding: Bottle Infant Disposition:home with mother Discharge instruction: per After Visit Summary and Postpartum booklet. Activity: Advance as tolerated. Pelvic rest for 6 weeks.  Diet: routine diet Anticipated Birth Control: Patch desired Postpartum Appointment:6 weeks Additional Postpartum F/U: none Future Appointments: Future Appointments  Date Time Provider [place]  06/16/2020 11:20 AM Gae Dry, MD [facility] None   Follow up Visit:  Follow-up Information    Gae Dry, MD. Go on 06/16/2020.   Specialty: Obstetrics and Gynecology Why: at 1120 with Dr Kenton Kingfisher for an incision check Contact information: [address] [place] [address] [phone]                   06/05/2020 Dalia Heading, CNM

## 2020-06-03 NOTE — Progress Notes (Signed)
RN to offer patient phenergan or benedryl for nausea. Pt declined for fear that it would make her even more sleepy than she already is. Pt has ice chips, emesis bags, and cold washcloth at the bedside. Will continue to monitor.

## 2020-06-03 NOTE — Op Note (Signed)
Cesarean Section Procedure Note Indications: prior cesarean section and term intrauterine pregnancy  Pre-operative Diagnosis: Intrauterine pregnancy [redacted]w[redacted]d ;  prior cesarean section and term intrauterine pregnancy Post-operative Diagnosis: same, delivered. Procedure: Low Transverse Cesarean Section Surgeon: Annamarie Major, MD, FACOG Assistant(s): Dr Jean Rosenthal, No other capable assistant available, in surgery requiring high level assistant.  Anesthesia: Spinal anesthesia Estimated Blood Loss:623 mL QBL Complications: None; patient tolerated the procedure well. Disposition: PACU - hemodynamically stable. Condition: stable  Findings: A female infant in the cephalic presentation. Amniotic fluid - Clear  Birth weight 8 -0 lbs.  Apgars of 8 and 9.  Intact placenta with a three-vessel cord. Grossly normal uterus, tubes and ovaries bilaterally. No intraabdominal adhesions were noted.  Procedure Details   The patient was taken to Operating Room, identified as the correct patient and the procedure verified as C-Section Delivery. A Time Out was held and the above information confirmed. After induction of anesthesia, the patient was draped and prepped in the usual sterile manner. A Pfannenstiel incision was made and carried down through the subcutaneous tissue to the fascia. Fascial incision was made and extended transversely with the Mayo scissors. The fascia was separated from the underlying rectus tissue superiorly and inferiorly. The peritoneum was identified and entered bluntly. Peritoneal incision was extended longitudinally. The utero-vesical peritoneal reflection was incised transversely and a bladder flap was created digitally.  A low transverse hysterotomy was made. The fetus was delivered atraumatically. The umbilical cord was clamped x2 and cut and the infant was handed to the awaiting pediatricians. The placenta was removed intact and appeared normal with a 3-vessel cord.  The uterus was  exteriorized and cleared of all clot and debris. The hysterotomy was closed with running sutures of 0 Vicryl suture. A second imbricating layer was placed with the same suture. Excellent hemostasis was observed. The uterus was returned to the abdomen. The pelvis was irrigated and again, excellent hemostasis was noted.  The On Q Pain pump System was then placed.  Trocars were placed through the abdominal wall into the subfascial space and these were used to thread the silver soaker cathaters into place.The rectus fascia was then reapproximated with running sutures of Maxon, with careful placement not to incorporate the cathaters. Subcutaneous tissues are then irrigated with saline and hemostasis assured.  Skin is then closed with 4-0 vicryl suture in a subcuticular fashion followed by skin adhesive. The cathaters are flushed each with 5 mL of Bupivicaine and stabilized into place with dressing. Instrument, sponge, and needle counts were correct prior to the abdominal closure and at the conclusion of the case. The surgical assistant performed tissue retraction, assistance with suturing, and fundal pressure.  The patient tolerated the procedure well and was transferred to the recovery room in stable condition.   Annamarie Major, MD, [facility], [facility] 06/03/2020  8:34 AM

## 2020-06-03 NOTE — Interval H&P Note (Signed)
History and Physical Interval Note:  06/03/2020 6:52 AM  Kellie Mejia  has presented today for surgery, with the diagnosis of Prior cesarean section.  The various methods of treatment have been discussed with the patient and family. After consideration of risks, benefits and other options for treatment, the patient has consented to  Procedure(s): CESAREAN SECTION (N/A) as a surgical intervention.  The patient's history has been reviewed, patient examined, no change in status, stable for surgery.  I have reviewed the patient's chart and labs.  Questions were answered to the patient's satisfaction.    Clinic [facility] Prenatal Labs  Dating [place] 6 weeks Blood type: A/Positive/-- (01/06 1459)   Genetic Screen NIPS:nml XX Antibody:Negative (01/06 1459)  Anatomic [place] [facility], nml, mild pelviectasis  Rubella: 4.38 (01/06 1459) Varicella: Imm  GTT Third trimester: nml RPR: Non Reactive (01/06 1459)   Rhogam n/a HBsAg: Negative (01/06 1459)   TDaP vaccine   04/08/20   Flu Shot:no HIV: Non Reactive (01/06 1459)   Baby Food        Will try breast feeding, didn't go well last pregnancy  GBS: negative  Contraception Patch if not breast feeding Pap:11/27/2019  CBB  No    CS/VBAC VBAC desired (LTCS 2016) if labors prior to 7/14      Letitia Libra

## 2020-06-04 LAB — CBC
HCT: 32.6 % — ABNORMAL LOW (ref 36.0–46.0)
Hemoglobin: 11.2 g/dL — ABNORMAL LOW (ref 12.0–15.0)
MCH: 28.6 pg (ref 26.0–34.0)
MCHC: 34.4 g/dL (ref 30.0–36.0)
MCV: 83.2 fL (ref 80.0–100.0)
Platelets: 256 10*3/uL (ref 150–400)
RBC: 3.92 MIL/uL (ref 3.87–5.11)
RDW: 13.3 % (ref 11.5–15.5)
WBC: 12.9 10*3/uL — ABNORMAL HIGH (ref 4.0–10.5)
nRBC: 0 % (ref 0.0–0.2)

## 2020-06-04 NOTE — Anesthesia Post-op Follow-up Note (Addendum)
  Anesthesia Pain Follow-up Note  Patient: Kellie Mejia  Day #: 1  Date of Follow-up: 06/04/2020 Time: 7:23 AM  Last Vitals:  Vitals:   06/04/20 0012 06/04/20 0311  BP: 109/68 115/64  Pulse: 65 64  Resp: 16 16  Temp: 36.9 C 36.7 C  SpO2: 100% 100%    Level of Consciousness: alert  Pain: none   Side Effects:None  Catheter Site Exam:clean, dry, no drainage     Plan: D/C from anesthesia care at surgeon's request  Starling Manns

## 2020-06-04 NOTE — Lactation Note (Signed)
This note was copied from a baby's chart. Lactation Consultation Note  Patient Name: Kellie Mejia HTDSK'A Date: 06/04/2020 Reason for consult: Follow-up assessment  Lactation follow-up. Mom continuing to put baby to breast, but has offered formula, over night, to aid in feedings. Mom just finished feeding at the breast when [facility] entered. Baby asleep and content skin to skin on mom's chest now. Mom reports total of at the breast, offering both sides.  Mom reports baby latching well, no pain or discomfort, strong tugging sensation. LC reviewed newborn feeding behaviors, growth spurts/cluster feeding, praised skin to skin, and offered breastfeeding support as needed today. Encouraged breast over bottle/supplement at each feeding. Encouraged to call out with any questions/concerns or for breastfeeding help throughout today.  Maternal Data Formula Feeding for Exclusion: No Has patient been taught Hand Expression?: Yes  Feeding Feeding Type: Breast Fed Nipple Type: Slow - flow  LATCH Score                   Interventions Interventions: Breast feeding basics reviewed  Lactation Tools Discussed/Used     Consult Status Consult Status: PRN Date: 06/04/20 Follow-up type: Call as needed    Danford Bad 06/04/2020, 11:30 AM

## 2020-06-04 NOTE — Anesthesia Postprocedure Evaluation (Signed)
Anesthesia Post Note  Patient: Kellie Mejia  Procedure(s) Performed: CESAREAN SECTION (N/A )  Patient location during evaluation: Mother Baby Anesthesia Type: Spinal Level of consciousness: oriented and awake and alert Pain management: pain level controlled Vital Signs Assessment: post-procedure vital signs reviewed and stable Respiratory status: spontaneous breathing and respiratory function stable Cardiovascular status: blood pressure returned to baseline and stable Postop Assessment: no headache, no backache, no apparent nausea or vomiting and able to ambulate Anesthetic complications: no   No complications documented.   Last Vitals:  Vitals:   06/04/20 0012 06/04/20 0311  BP: 109/68 115/64  Pulse: 65 64  Resp: 16 16  Temp: 36.9 C 36.7 C  SpO2: 100% 100%    Last Pain:  Vitals:   06/04/20 0311  TempSrc: Oral  PainSc:                  Starling Manns

## 2020-06-04 NOTE — Progress Notes (Signed)
Subjective: Postpartum Day 1: Cesarean Delivery Patient reports tolerating PO, + flatus and no problems voiding.  She has been breastfeeding, and this has gone fairly well. She opted to give some formula when she felt the baby was not taking in ample colostrum. She is very tired, having had little sleep, but her pain is well controlled.  Objective: Vital signs in last 24 hours: Temp:  [97.9 F (36.6 C)-98.4 F (36.9 C)] 98.1 F (36.7 C) (07/15 0847) Pulse Rate:  [62-81] 69 (07/15 0900) Resp:  [16-20] 18 (07/15 0847) BP: (109-120)/(59-77) 109/66 (07/15 0847) SpO2:  [95 %-100 %] 95 % (07/15 0900)  Physical Exam:  General: alert, cooperative, appears stated age and fatigued Lochia: appropriate Uterine Fundus: firm Incision: healing well, no significant drainage DVT Evaluation: No evidence of DVT seen on physical exam. Negative Homan's sign. No cords or calf tenderness.  Recent Labs    06/02/20 0943 06/04/20 0538  HGB 11.5* 11.2*  HCT 34.6* 32.6*    Assessment/Plan: Status post Cesarean section. Doing well postoperatively.  Continue current care.  Mirna Mires 06/04/2020, 10:17 AM

## 2020-06-05 ENCOUNTER — Encounter: Payer: Self-pay | Admitting: Obstetrics & Gynecology

## 2020-06-05 MED ORDER — OXYCODONE-ACETAMINOPHEN 5-325 MG PO TABS: 1.0000 | ORAL_TABLET | Freq: Four times a day (QID) | ORAL | 0 refills | Status: AC | PRN

## 2020-06-05 MED ORDER — SENNOSIDES-DOCUSATE SODIUM 8.6-50 MG PO TABS: 2.0000 | ORAL_TABLET | Freq: Every evening | ORAL | 0 refills | Status: DC | PRN

## 2020-06-05 MED ORDER — IBUPROFEN 800 MG PO TABS: 800.0000 mg | ORAL_TABLET | Freq: Four times a day (QID) | ORAL | 0 refills | Status: DC

## 2020-06-05 NOTE — Progress Notes (Signed)
POD #2 Repeat Cesarean section Subjective:   Doing well. Wants to go home. Tolerating regular diet. Passing flatus. Voiding without difficulty.  Taking Motrin and occasional oxycodone for pain relief. Has decided to bottle feed  Objective:  Blood pressure 110/75, pulse 70, temperature 98.4 F (36.9 C), temperature source Oral, resp. rate 20, height 5\' 4"  (1.626 m), weight 99 kg, last menstrual period 08/26/2019, SpO2 100 %,  General: WF in NAD Heart: RRR without murmur Pulmonary: no increased work of breathing/ CTAB Abdomen: non-distended, NABS, fundus firm at level of umbilicus-2FB Incision: Honeycomb dressing C&D&I, ON Q intact Extremities: no edema, no erythema, no tenderness  Results for orders placed or performed during the hospital encounter of 06/03/20 (from the past 72 hour(s))  ABO/Rh     Status: None   Collection Time: 06/03/20  6:17 AM  Result Value Ref Range   ABO/RH(D)      A POS Performed at [facility], [address] [place], [place]   CBC     Status: Abnormal   Collection Time: 06/04/20  5:38 AM  Result Value Ref Range   WBC 12.9 (H) 4.0 - 10.5 K/uL   RBC 3.92 3.87 - 5.11 MIL/uL   Hemoglobin 11.2 (L) 12.0 - 15.0 g/dL   HCT 06/06/20 (L) 36 - 46 %   MCV 83.2 80.0 - 100.0 fL   MCH 28.6 26.0 - 34.0 pg   MCHC 34.4 30.0 - 36.0 g/dL   RDW 27.0 62.3 - 76.2 %   Platelets 256 150 - 400 K/uL   nRBC 0.0 0.0 - 0.2 %    Comment: Performed at [facility], [address], [place], [place]     Assessment:   29 y.o. 34 postoperativeday # 2   Plan:  1) Discharge home today with instructions. Follow up for incision check in 1 week.  2) A POS/ RI/ VI  3) TDAP -given AP  4) Bottle/Contraception: Patch is planned  [id number], CNM

## 2020-06-05 NOTE — Discharge Instructions (Signed)
Cesarean Delivery, Care After This sheet gives you information about how to care for yourself after your procedure. Your health care provider may also give you more specific instructions. If you have problems or questions, contact your health care provider. What can I expect after the procedure? After the procedure, it is common to have:  A small amount of blood or clear fluid coming from the incision.  Some redness, swelling, and pain in your incision area.  Some abdominal pain and soreness.  Vaginal bleeding (lochia). Even though you did not have a vaginal delivery, you will still have vaginal bleeding and discharge.  Pelvic cramps.  Fatigue. You may have pain, swelling, and discomfort in the tissue between your vagina and your anus (perineum) if:  Your C-section was unplanned, and you were allowed to labor and push.  An incision was made in the area (episiotomy) or the tissue tore during attempted vaginal delivery. Follow these instructions at home: Incision care   Follow instructions from your health care provider about how to take care of your incision. Make sure you: ? Wash your hands with soap and water before you change your bandage (dressing). If soap and water are not available, use hand sanitizer. ? If you have a dressing, change it or remove it as told by your health care provider. ? Leave stitches (sutures), skin staples, skin glue, or adhesive strips in place. These skin closures may need to stay in place for 2 weeks or longer. If adhesive strip edges start to loosen and curl up, you may trim the loose edges. Do not remove adhesive strips completely unless your health care provider tells you to do that.  Check your incision area every day for signs of infection. Check for: ? More redness, swelling, or pain. ? More fluid or blood. ? Warmth. ? Pus or a bad smell. You can remove your ON Q pump on the 4th day after your Cesarean section. You will need to apply a dressing  (or half a Kotex pad) over the catheter sites and change them if they are saturated.   Do not take baths, swim, or use a hot tub until your health care provider says it's okay. Ask your health care provider if you can take showers.  When you cough or sneeze, hug a pillow. This helps with pain and decreases the chance of your incision opening up (dehiscing). Do this until your incision heals. Medicines  Take over-the-counter and prescription medicines only as told by your health care provider.  If you were prescribed an antibiotic medicine, take it as told by your health care provider. Do not stop taking the antibiotic even if you start to feel better.  Do not drive or use heavy machinery while taking prescription pain medicine. Lifestyle  Do not drink alcohol. This is especially important if you are breastfeeding or taking pain medicine.  Do not use any products that contain nicotine or tobacco, such as cigarettes, e-cigarettes, and chewing tobacco. If you need help quitting, ask your health care provider. Eating and drinking  Drink at least 8 eight-ounce glasses of water every day unless told not to by your health care provider. If you breastfeed, you may need to drink even more water.  Eat high-fiber foods every day. These foods may help prevent or relieve constipation. High-fiber foods include: ? Whole grain cereals and breads. ? Brown rice. ? Beans. ? Fresh fruits and vegetables. Activity   If possible, have someone help you care for your baby  and help with household activities for at least a few days after you leave the hospital.  Return to your normal activities as told by your health care provider. Ask your health care provider what activities are safe for you.  Rest as much as possible. Try to rest or take a nap while your baby is sleeping.  Do not lift anything that is heavier than 10 lbs (4.5 kg), or the limit that you were told, until your health care provider says that  it is safe.  Talk with your health care provider about when you can engage in sexual activity. This may depend on your: ? Risk of infection. ? How fast you heal. ? Comfort and desire to engage in sexual activity. General instructions  Do not use tampons or douches until your health care provider approves.  Wear loose, comfortable clothing and a supportive and well-fitting bra.  Keep your perineum clean and dry. Wipe from front to back when you use the toilet.  If you pass a blood clot, save it and call your health care provider to discuss. Do not flush blood clots down the toilet before you get instructions from your health care provider.  Keep all follow-up visits for you and your baby as told by your health care provider. This is important. Contact a health care provider if:  You have: ? A fever. ? Bad-smelling vaginal discharge. ? Pus or a bad smell coming from your incision. ? Difficulty or pain when urinating. ? A sudden increase or decrease in the frequency of your bowel movements. ? More redness, swelling, or pain around your incision. ? More fluid or blood coming from your incision. ? A rash. ? Nausea. ? Little or no interest in activities you used to enjoy. ? Questions about caring for yourself or your baby.  Your incision feels warm to the touch.  Your breasts turn red or become painful or hard.  You feel unusually sad or worried.  You vomit.  You pass a blood clot from your vagina.  You urinate more than usual.  You are dizzy or light-headed. Get help right away if:  You have: ? Pain that does not go away or get better with medicine. ? Chest pain. ? Difficulty breathing. ? Blurred vision or spots in your vision. ? Thoughts about hurting yourself or your baby. ? New pain in your abdomen or in one of your legs. ? A severe headache.  You faint.  You bleed from your vagina so much that you fill more than one sanitary pad in one hour. Bleeding should  not be heavier than your heaviest period. Summary  After the procedure, it is common to have pain at your incision site, abdominal cramping, and slight bleeding from your vagina.  Check your incision area every day for signs of infection.  Tell your health care provider about any unusual symptoms.  Keep all follow-up visits for you and your baby as told by your health care provider. This information is not intended to replace advice given to you by your health care provider. Make sure you discuss any questions you have with your health care provider. Document Revised: 05/16/2018 Document Reviewed: 05/16/2018 Elsevier Patient Education  2020 [employer].

## 2020-06-05 NOTE — Progress Notes (Signed)
Discharge instructions, prescriptions, education, and appointments given and explained. Pt verbalized understanding with no further questions. Pt wheeled to personal vehicle via staff 

## 2020-06-08 ENCOUNTER — Telehealth: Payer: Self-pay

## 2020-06-08 NOTE — Telephone Encounter (Signed)
PC to pt.  Voice mail full; unable to leave message at this time re: matter related to being at [facility] last week.

## 2020-06-16 ENCOUNTER — Other Ambulatory Visit: Payer: Self-pay

## 2020-06-16 ENCOUNTER — Ambulatory Visit (INDEPENDENT_AMBULATORY_CARE_PROVIDER_SITE_OTHER): Payer: Medicaid Other | Admitting: Obstetrics & Gynecology

## 2020-06-16 ENCOUNTER — Encounter: Payer: Self-pay | Admitting: Obstetrics & Gynecology

## 2020-06-16 VITALS — BP 130/70 | Ht 64.0 in | Wt 206.2 lb

## 2020-06-16 DIAGNOSIS — Z98891 History of uterine scar from previous surgery: Secondary | ICD-10-CM

## 2020-06-16 MED ORDER — XULANE 150-35 MCG/24HR TD PTWK
1.0000 | MEDICATED_PATCH | TRANSDERMAL | 4 refills | Status: DC
Start: 2020-06-16 — End: 2020-07-16

## 2020-06-16 NOTE — Progress Notes (Signed)
  Postoperative Follow-up Patient presents post op from recent Cesarean Section performed for Elective repeat, 2 weeks ago.   Subjective: Patient reports marked improvement in her immediate post op symptoms. Eating a regular diet without difficulty. The patient is not having any pain.  Activity: normal activities of daily living. Patient reports additional symptom's since surgery of appropriate lochia, no signs of depression, and no signs of mastitis.  Objective: BP (!) 130/70   Ht 5\' 4"  (1.626 m)   Wt (!) 206 lb 3.2 oz (93.5 kg)   Breastfeeding No   BMI 35.39 kg/m  Physical Exam Constitutional:      General: She is not in acute distress.    Appearance: She is well-developed.  Cardiovascular:     Rate and Rhythm: Normal rate.  Pulmonary:     Effort: Pulmonary effort is normal.  Abdominal:     General: There is no distension.     Palpations: Abdomen is soft.     Tenderness: There is no abdominal tenderness.     Comments: Incision Healing Well   Musculoskeletal:        General: Normal range of motion.  Neurological:     Mental Status: She is alert and oriented to person, place, and time.     Cranial Nerves: No cranial nerve deficit.  Skin:    General: Skin is warm and dry.     Assessment: s/p : Cesarean Section for Elective repeat progressing well  Plan: Patient has done well after her Cesarean Section with no apparent complications.  I have discussed the post-operative course to date, and the expected progress moving forward.  The patient understands what complications to be concerned about.  I will see the patient in routine follow up, or sooner if needed.    Activity plan: No heavy lifting.  Pelvic rest. She desires Marland Kitchen for postpartum contraception.  Lucienne Minks 06/16/2020, 12:02 PM

## 2020-06-16 NOTE — Patient Instructions (Signed)
Thank you for choosing [facility]. As part of our ongoing efforts to improve patient experience, we would appreciate your feedback. Please fill out the short survey that you will receive by mail or MyChart. Your opinion is important to us! -Dr Jaidynn Balster  

## 2020-07-16 ENCOUNTER — Encounter: Payer: Self-pay | Admitting: Obstetrics & Gynecology

## 2020-07-16 ENCOUNTER — Other Ambulatory Visit: Payer: Self-pay

## 2020-07-16 ENCOUNTER — Ambulatory Visit (INDEPENDENT_AMBULATORY_CARE_PROVIDER_SITE_OTHER): Payer: Medicaid Other | Admitting: Obstetrics & Gynecology

## 2020-07-16 VITALS — BP 100/70 | Ht 64.0 in | Wt 213.0 lb

## 2020-07-16 DIAGNOSIS — Z98891 History of uterine scar from previous surgery: Secondary | ICD-10-CM

## 2020-07-16 MED ORDER — XULANE 150-35 MCG/24HR TD PTWK: 1.0000 | MEDICATED_PATCH | TRANSDERMAL | 4 refills | Status: AC

## 2020-07-16 NOTE — Patient Instructions (Signed)
Thank you for choosing [facility]. As part of our ongoing efforts to improve patient experience, we would appreciate your feedback. Please fill out the short survey that you will receive by mail or MyChart. Your opinion is important to us! -Dr Demareon Coldwell  

## 2020-07-16 NOTE — Progress Notes (Signed)
  OBSTETRICS POSTPARTUM CLINIC PROGRESS NOTE  Subjective:     Kellie Mejia is a 29 y.o. [phone] female who presents for a postpartum visit. She is 6 weeks postpartum following a Term pregnancy and delivery by C-section.  I have fully reviewed the prenatal and intrapartum course. Anesthesia: spinal.  Postpartum course has been complicated by uncomplicated.  Baby is feeding by Bottle.  Bleeding: patient has not  resumed menses.  Bowel function is normal. Bladder function is normal.  Patient is not sexually active. Contraception method desired is Ship broker weekly.  Postpartum depression screening: negative. Edinburgh 4.  The following portions of the patient's history were reviewed and updated as appropriate: allergies, current medications, past family history, past medical history, past social history, past surgical history and problem list.  Review of Systems Pertinent items are noted in HPI.  Objective:    BP 100/70   Ht 5\' 4"  (1.626 m)   Wt 213 lb (96.6 kg)   BMI 36.56 kg/m   General:  alert and no distress   Breasts:  inspection negative, no nipple discharge or bleeding, no masses or nodularity palpable  Lungs: clear to auscultation bilaterally  Heart:  regular rate and rhythm, S1, S2 normal, no murmur, click, rub or gallop  Abdomen: soft, non-tender; bowel sounds normal; no masses,  no organomegaly.  Well healed Pfannenstiel incision   Vulva:  normal  Vagina: normal vagina, no discharge, exudate, lesion, or erythema  Cervix:  no cervical motion tenderness and no lesions  Corpus: normal size, contour, position, consistency, mobility, non-tender  Adnexa:  normal adnexa and no mass, fullness, tenderness  Rectal Exam: Not performed.          Assessment:  Post Partum Care visit 1. S/P cesarean section   Plan:  See orders and Patient Instructions Contraceptive counseling for Ortho-Evra Resume all normal activities Follow up in: 4 months or as needed.     , MD, Annamarie Major Ob/Gyn, [facility] 07/16/2020  2:13 PM

## 2020-12-14 ENCOUNTER — Ambulatory Visit: Payer: Medicaid Other | Admitting: Obstetrics & Gynecology

## 2020-12-30 ENCOUNTER — Ambulatory Visit: Payer: Medicaid Other | Admitting: Obstetrics & Gynecology

## 2021-01-11 ENCOUNTER — Other Ambulatory Visit (HOSPITAL_COMMUNITY)
Admission: RE | Admit: 2021-01-11 | Discharge: 2021-01-11 | Disposition: A | Discharge status: Home / Self Care | Payer: Medicaid Other | Source: Ambulatory Visit | Attending: Obstetrics & Gynecology | Admitting: Obstetrics & Gynecology

## 2021-01-11 ENCOUNTER — Other Ambulatory Visit: Payer: Self-pay

## 2021-01-11 ENCOUNTER — Ambulatory Visit (INDEPENDENT_AMBULATORY_CARE_PROVIDER_SITE_OTHER): Payer: Medicaid Other | Admitting: Obstetrics & Gynecology

## 2021-01-11 ENCOUNTER — Encounter: Payer: Self-pay | Admitting: Obstetrics & Gynecology

## 2021-01-11 VITALS — BP 120/70 | Ht 64.0 in | Wt 212.0 lb

## 2021-01-11 DIAGNOSIS — Z01419 Encounter for gynecological examination (general) (routine) without abnormal findings: Secondary | ICD-10-CM

## 2021-01-11 DIAGNOSIS — Z124 Encounter for screening for malignant neoplasm of cervix: Secondary | ICD-10-CM

## 2021-01-11 DIAGNOSIS — Z1329 Encounter for screening for other suspected endocrine disorder: Secondary | ICD-10-CM

## 2021-01-11 DIAGNOSIS — Z131 Encounter for screening for diabetes mellitus: Secondary | ICD-10-CM | POA: Diagnosis not present

## 2021-01-11 DIAGNOSIS — Z1321 Encounter for screening for nutritional disorder: Secondary | ICD-10-CM

## 2021-01-11 DIAGNOSIS — Z13220 Encounter for screening for lipoid disorders: Secondary | ICD-10-CM

## 2021-01-11 NOTE — Patient Instructions (Signed)
Thank you for choosing [facility]. As part of our ongoing efforts to improve patient experience, we would appreciate your feedback. Please fill out the short survey that you will receive by mail or MyChart. Your opinion is important to us! -Dr Kamaile Zachow  Recommendations to boost your immunity to prevent illness such as viral flu and colds, including covid19, are as follows:       - - -  Vitamin K2 and Vitamin D3  - - - Take Vitamin K2 at 200-300 mcg daily (usually 2-3 pills daily of the over the counter formulation). Take Vitamin D3 at 3000-4000 U daily (usually 3-4 pills daily of the over the counter formulation). Studies show that these two at high normal levels in your system are very effective in keeping your immunity so strong and protective that you will be unlikely to contract viral illness such as those listed above.  Dr Breyanna Valera  

## 2021-01-11 NOTE — Progress Notes (Signed)
HPI:      [name] is a 30 y.o. [id number] who LMP was Patient's last menstrual period was 12/14/2020., she presents today for her annual examination. The patient has no complaints today. The patient is sexually active. Her last pap: approximate date 2020 and was normal. The patient does perform self breast exams.  There is no notable family history of breast or ovarian cancer in her family.  The patient has regular exercise: yes.  The patient denies current symptoms of depression.    GYN History: Contraception: condoms  PMHx: Past Medical History:  Diagnosis Date  . Anxiety   . Asthma    excercise induced  . Depression   . GERD (gastroesophageal reflux disease)    Past Surgical History:  Procedure Laterality Date  . CESAREAN SECTION N/A 07/17/2015   Procedure: CESAREAN SECTION;  Surgeon: Suzy Bouchard, MD;  Location: [facility] ORS;  Service: Obstetrics;  Laterality: N/A;  . CESAREAN SECTION N/A 06/03/2020   Procedure: CESAREAN SECTION;  Surgeon: Nadara Mustard, MD;  Location: [facility] ORS;  Service: Obstetrics;  Laterality: N/A;  . WISDOM TOOTH EXTRACTION     Family History  Problem Relation Age of Onset  . Hypertension Maternal Grandfather    Social History   Tobacco Use  . Smoking status: Never Smoker  . Smokeless tobacco: Never Used  Vaping Use  . Vaping Use: Never used  Substance Use Topics  . Alcohol use: Not Currently  . Drug use: Not Currently    Types: Marijuana    Comment: not in last year    Current Outpatient Medications:  .  norelgestromin-ethinyl estradiol [facility]) 150-35 MCG/24HR transdermal patch, Place 1 patch onto the skin once a week. One patch weekly for 3 weeks, then one week off. (Patient not taking: Reported on 01/11/2021), Disp: 9 patch, Rfl: 4 Allergies: Patient has no known allergies.  Review of Systems  Constitutional: Negative for chills, fever and malaise/fatigue.  HENT: Negative for congestion, sinus pain and sore throat.    Eyes: Negative for blurred vision and pain.  Respiratory: Negative for cough and wheezing.   Cardiovascular: Negative for chest pain and leg swelling.  Gastrointestinal: Negative for abdominal pain, constipation, diarrhea, heartburn, nausea and vomiting.  Genitourinary: Negative for dysuria, frequency, hematuria and urgency.  Musculoskeletal: Negative for back pain, joint pain, myalgias and neck pain.  Skin: Negative for itching and rash.  Neurological: Negative for dizziness, tremors and weakness.  Endo/Heme/Allergies: Does not bruise/bleed easily.  Psychiatric/Behavioral: Positive for depression. The patient is not nervous/anxious and does not have insomnia.     Objective: BP 120/70   Ht 5\' 4"  (1.626 m)   Wt 212 lb (96.2 kg)   LMP 12/14/2020   BMI 36.39 kg/m   Filed Weights   01/11/21 1049  Weight: 212 lb (96.2 kg)   Body mass index is 36.39 kg/m. Physical Exam Constitutional:      General: She is not in acute distress.    Appearance: She is well-developed and well-nourished.  Genitourinary:     Vagina, uterus and rectum normal.     There is no rash or lesion on the right labia.     There is no rash or lesion on the left labia.    No lesions in the vagina.     No vaginal bleeding.      Right Adnexa: not tender and no mass present.    Left Adnexa: not tender and no mass present.    No cervical  motion tenderness, friability, lesion or polyp.     Uterus is mobile.     Uterus is not enlarged.     No uterine mass detected.    Uterus is midaxial.     Pelvic exam was performed with patient supine.  Breasts:     Right: No mass, skin change or tenderness.     Left: No mass, skin change or tenderness.    HENT:     Head: Normocephalic and atraumatic. No laceration.     Right Ear: Hearing normal.     Left Ear: Hearing normal.     Nose: No epistaxis or foreign body.     Mouth/Throat:     Mouth: Oropharynx is clear and moist and mucous membranes are normal.     Pharynx:  Uvula midline.  Eyes:     Pupils: Pupils are equal, round, and reactive to light.  Neck:     Thyroid: No thyromegaly.  Cardiovascular:     Rate and Rhythm: Normal rate and regular rhythm.     Heart sounds: No murmur heard. No friction rub. No gallop.   Pulmonary:     Effort: Pulmonary effort is normal. No respiratory distress.     Breath sounds: Normal breath sounds. No wheezing.  Abdominal:     General: Bowel sounds are normal. There is no distension.     Palpations: Abdomen is soft.     Tenderness: There is no abdominal tenderness. There is no rebound.  Musculoskeletal:        General: Normal range of motion.     Cervical back: Normal range of motion and neck supple.  Neurological:     Mental Status: She is alert and oriented to person, place, and time.     Cranial Nerves: No cranial nerve deficit.  Skin:    General: Skin is warm and dry.  Psychiatric:        Mood and Affect: Mood and affect normal.        Judgment: Judgment normal.  Vitals reviewed.     Assessment:  ANNUAL EXAM 1. Women's annual routine gynecological examination   2. Screening for cervical cancer   3. Screening for thyroid disorder   4. Screening for diabetes mellitus   5. Screening for cholesterol level   6. Encounter for vitamin deficiency screening      Screening Plan:            1.  Cervical Screening-  Pap smear done today  2. Breast screening- Exam annually and mammogram>40 planned   3. Colonoscopy every 10 years, Hemoccult testing - after age 95  4. Labs To return fasting at a later date  5. Counseling for contraception: condoms  Upstream - 01/11/21 1052      Pregnancy Intention Screening   Does the patient want to become pregnant in the next year? No    Does the patient's partner want to become pregnant in the next year? No    Would the patient like to discuss contraceptive options today? No          The pregnancy intention screening data noted above was reviewed. Potential  methods of contraception were discussed. The patient elected to proceed with Female Condom.  Prior side effects w Hormone patch.  Will consider Paraguard or even hormones again at a later date.  Counseled as to prior CS 7 mos ago and desire to refrain from pregnancy for at least 18 mos.      F/U  Return in  about 1 year (around 01/11/2022) for AND LAB APPT SOON.  Annamarie Major, MD, [facility], [facility] 01/11/2021  11:14 AM

## 2021-01-12 LAB — CYTOLOGY - PAP: Diagnosis: NEGATIVE

## 2021-01-13 ENCOUNTER — Other Ambulatory Visit: Payer: Medicaid Other

## 2021-04-05 ENCOUNTER — Other Ambulatory Visit: Payer: Self-pay | Admitting: Obstetrics & Gynecology

## 2021-04-05 DIAGNOSIS — Z13220 Encounter for screening for lipoid disorders: Secondary | ICD-10-CM

## 2021-04-05 DIAGNOSIS — Z1329 Encounter for screening for other suspected endocrine disorder: Secondary | ICD-10-CM

## 2021-04-05 DIAGNOSIS — Z131 Encounter for screening for diabetes mellitus: Secondary | ICD-10-CM

## 2021-04-05 DIAGNOSIS — Z1321 Encounter for screening for nutritional disorder: Secondary | ICD-10-CM

## 2021-06-03 IMAGING — US US OB < 14 WKS - US OB TV - US DOPPLER
1 series · 13 of 28 positions shown · non-contrast
Comparison: None.

CLINICAL DATA: Right lower abdominal pain



[Series 1: us ob < 14 wks - us ob tv - us doppler · 13 of 135 slices shown]
[im 5/135]
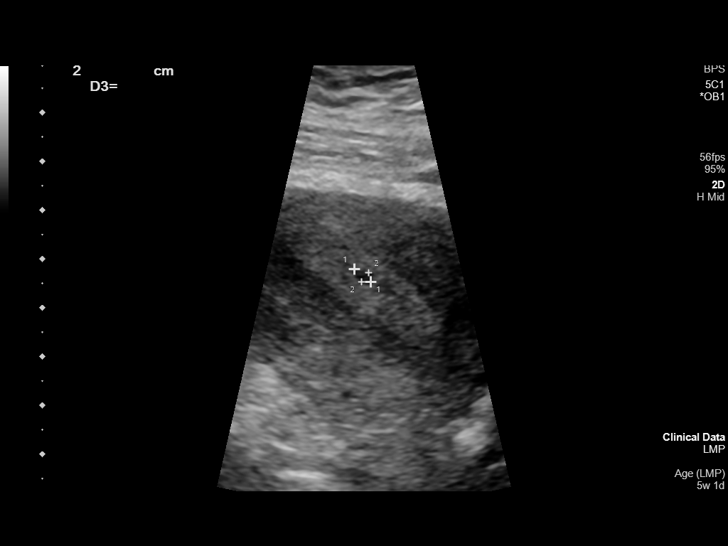
[im 15/135]
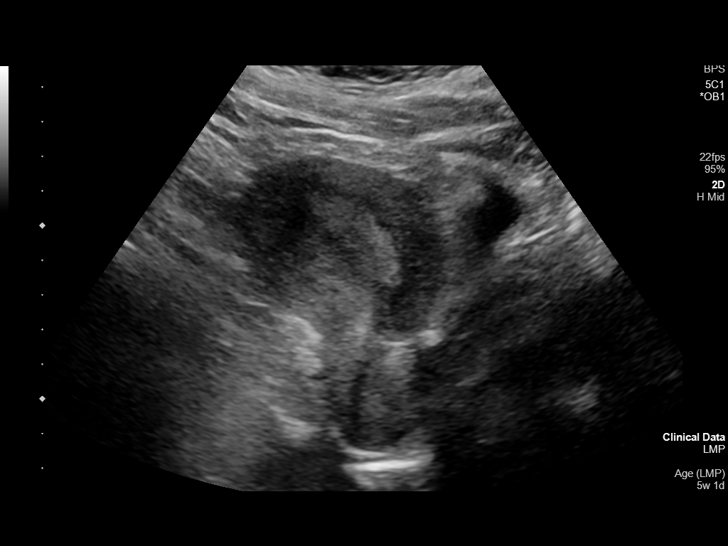
[im 25/135]
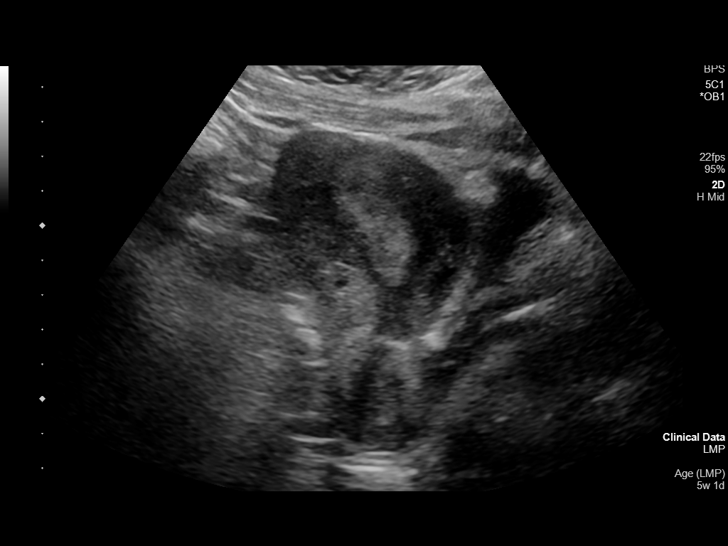
[im 35/135]
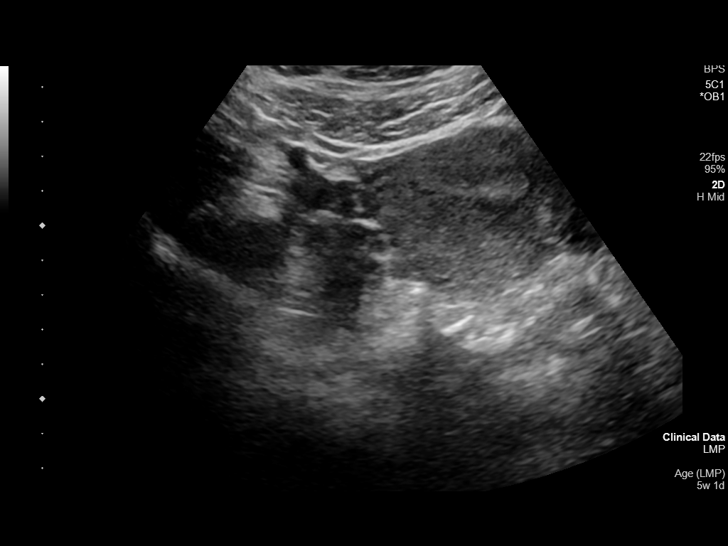
[im 45/135]
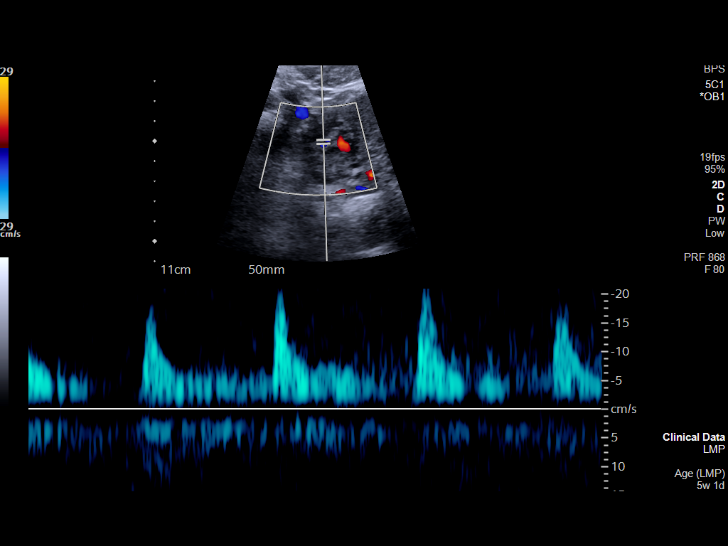
[im 55/135]
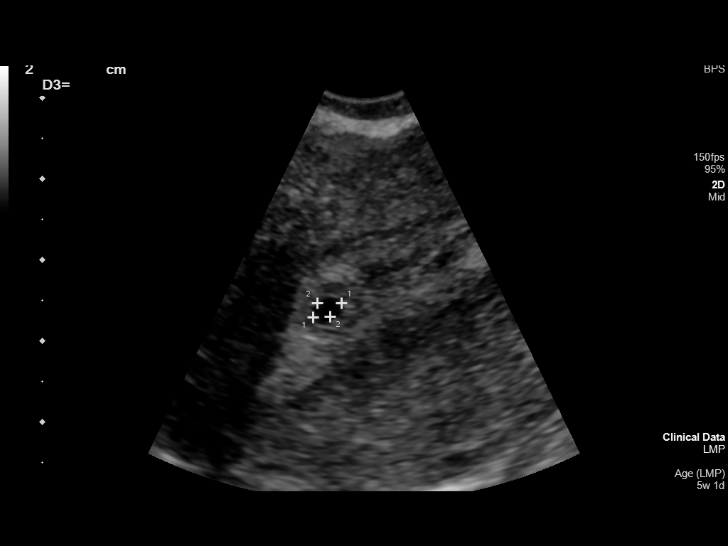
[im 70/135]
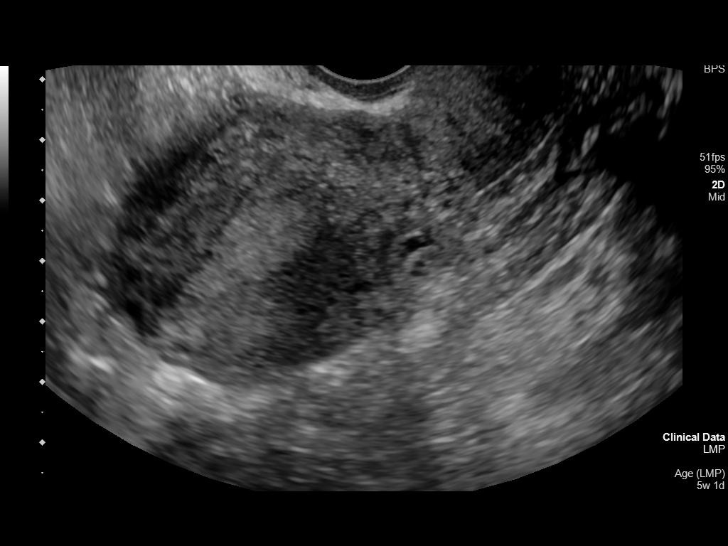
[im 80/135]
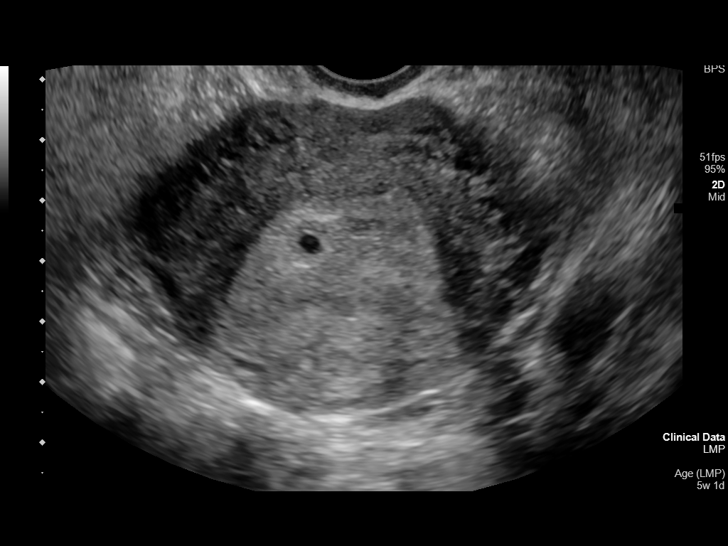
[im 90/135]
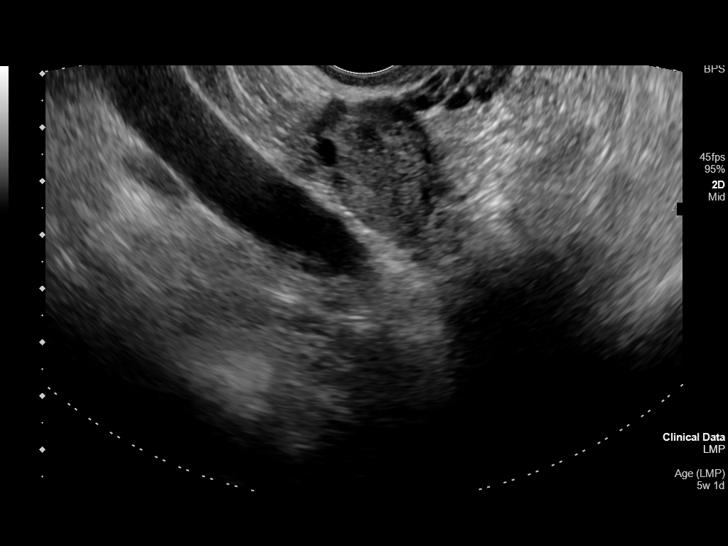
[im 100/135]
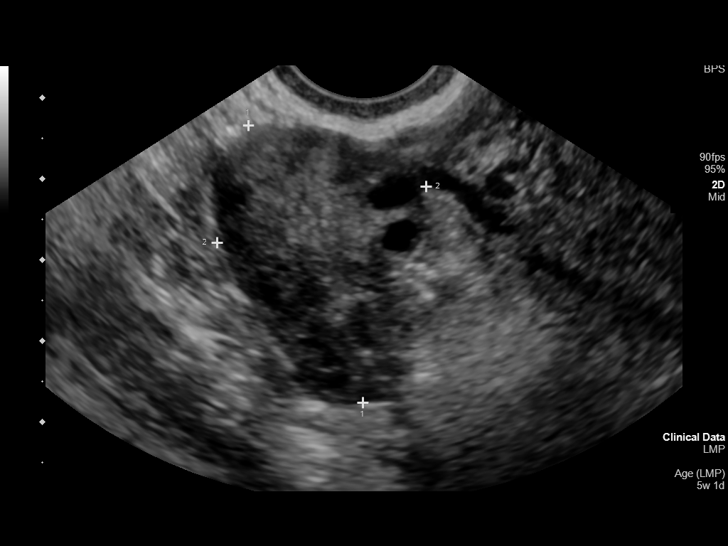
[im 110/135]
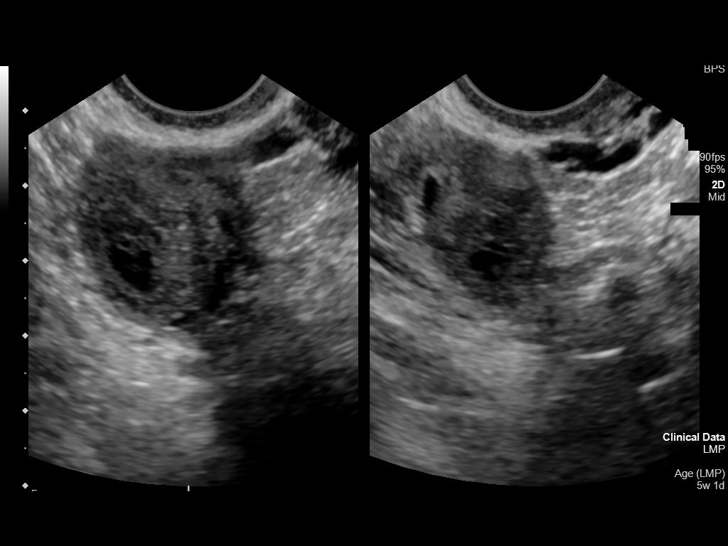
[im 120/135]
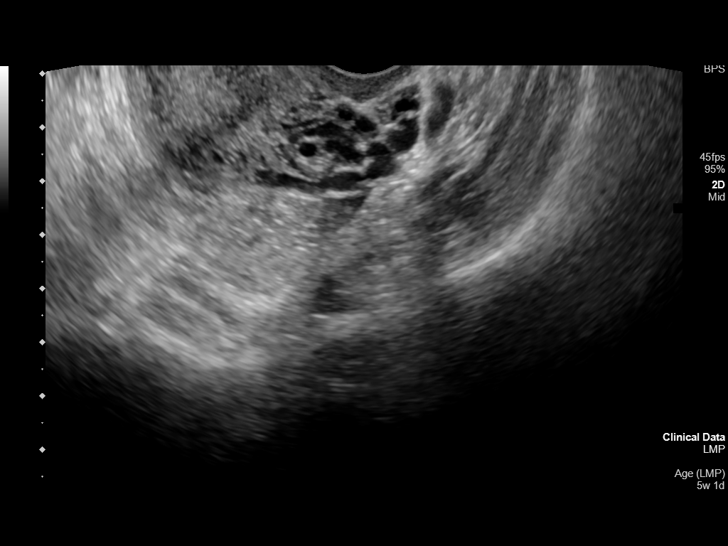
[im 130/135]
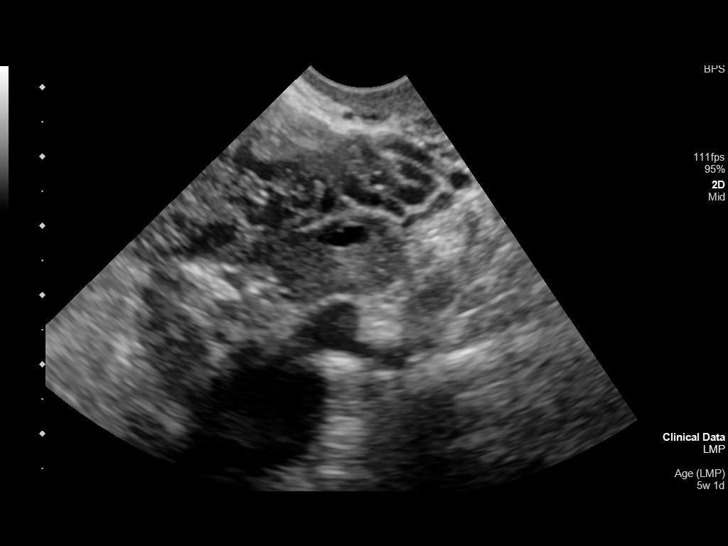

[13 of 28 positions shown; findings below may reference images not displayed]

FINDINGS: Intrauterine gestational sac: Single

Yolk sac:  Not Visualized.

Embryo:  Not Visualized.

Cardiac Activity: Not Visualized.

MSD: 3.2  mm   5 w   0  d

Subchorionic hemorrhage:  None visualized.

Maternal uterus/adnexae: 1.5 x 1.5 x 1.3 cm hypoechoic area in the
right ovary without internal Doppler flow may reflect a small corpus
luteum cyst. Normal left ovary. No pelvic free fluid.

Pulsed Doppler evaluation of both ovaries demonstrates normal
appearing low-resistance arterial and venous waveforms.
IMPRESSION: 1. Possible intrauterine gestational sac without a fetal pole.
Findings concerning for early pregnancy, but ectopic pregnancy
cannot be completely excluded. Recommend serial beta hCG and a
follow-up pelvic ultrasound in 1-2 weeks.

## 2022-01-15 ENCOUNTER — Emergency Department
Admission: EM | Admit: 2022-01-15 | Discharge: 2022-01-15 | Disposition: A | Discharge status: Home / Self Care | Payer: Medicaid Other | Attending: Emergency Medicine | Admitting: Emergency Medicine

## 2022-01-15 ENCOUNTER — Other Ambulatory Visit: Payer: Self-pay

## 2022-01-15 DIAGNOSIS — R824 Acetonuria: Secondary | ICD-10-CM | POA: Diagnosis not present

## 2022-01-15 DIAGNOSIS — R112 Nausea with vomiting, unspecified: Secondary | ICD-10-CM | POA: Diagnosis present

## 2022-01-15 DIAGNOSIS — K529 Noninfective gastroenteritis and colitis, unspecified: Secondary | ICD-10-CM | POA: Diagnosis not present

## 2022-01-15 LAB — CBC
HCT: 45.8 % (ref 36.0–46.0)
Hemoglobin: 15.6 g/dL — ABNORMAL HIGH (ref 12.0–15.0)
MCH: 30.2 pg (ref 26.0–34.0)
MCHC: 34.1 g/dL (ref 30.0–36.0)
MCV: 88.6 fL (ref 80.0–100.0)
Platelets: 303 10*3/uL (ref 150–400)
RBC: 5.17 MIL/uL — ABNORMAL HIGH (ref 3.87–5.11)
RDW: 12.4 % (ref 11.5–15.5)
WBC: 11.5 10*3/uL — ABNORMAL HIGH (ref 4.0–10.5)
nRBC: 0 % (ref 0.0–0.2)

## 2022-01-15 LAB — URINALYSIS, ROUTINE W REFLEX MICROSCOPIC
Bacteria, UA: NONE SEEN
Bilirubin Urine: NEGATIVE
Glucose, UA: NEGATIVE mg/dL
Ketones, ur: 80 mg/dL — AB
Leukocytes,Ua: NEGATIVE
Nitrite: NEGATIVE
Protein, ur: 30 mg/dL — AB
Specific Gravity, Urine: 1.029 (ref 1.005–1.030)
pH: 5 (ref 5.0–8.0)

## 2022-01-15 LAB — COMPREHENSIVE METABOLIC PANEL
ALT: 16 U/L (ref 0–44)
AST: 20 U/L (ref 15–41)
Albumin: 4.3 g/dL (ref 3.5–5.0)
Alkaline Phosphatase: 64 U/L (ref 38–126)
Anion gap: 11 (ref 5–15)
BUN: 10 mg/dL (ref 6–20)
CO2: 24 mmol/L (ref 22–32)
Calcium: 9.1 mg/dL (ref 8.9–10.3)
Chloride: 100 mmol/L (ref 98–111)
Creatinine, Ser: 1 mg/dL (ref 0.44–1.00)
GFR, Estimated: >60 mL/min (ref >60)
Glucose, Bld: 111 mg/dL — ABNORMAL HIGH (ref 70–99)
Potassium: 3.8 mmol/L (ref 3.5–5.1)
Sodium: 135 mmol/L (ref 135–145)
Total Bilirubin: 1 mg/dL (ref 0.3–1.2)
Total Protein: 7.8 g/dL (ref 6.5–8.1)

## 2022-01-15 LAB — LIPASE, BLOOD: Lipase: 24 U/L (ref 11–51)

## 2022-01-15 LAB — POC URINE PREG, ED: Preg Test, Ur: NEGATIVE

## 2022-01-15 MED ORDER — ONDANSETRON 4 MG PO TBDP
4.0000 mg | ORAL_TABLET | Freq: Once | ORAL | Status: AC | PRN
  Administered 2022-01-15: 4 mg via ORAL
  Filled 2022-01-15: qty 1

## 2022-01-15 MED ORDER — ONDANSETRON 4 MG PO TBDP: 4.0000 mg | ORAL_TABLET | Freq: Three times a day (TID) | ORAL | 0 refills | Status: AC | PRN

## 2022-01-15 NOTE — ED Provider Notes (Signed)
[facility] Provider Note    Event Date/Time   First MD Initiated Contact with Patient 01/15/22 1107     (approximate)   History   Emesis and Diarrhea   HPI  Kellie Mejia is a 31 y.o. female without significant past medical history presents for evaluation of 3 to 4 days of nausea and vomiting associated with them nonbloody diarrhea and some myalgias in her back.  No fevers, chest pain, cough, shortness of breath, abdominal pain, vaginal bleeding or discharge, rash or extremity pain.  She states that sometimes it burns little bit when she pees.  Very little appetite and decreased p.o. intake.  No other acute concerns.  No recent travel outside [place] or suspicious food intake.  No sick contacts.  No recent EtOH.      Physical Exam  Triage Vital Signs: ED Triage Vitals [01/15/22 0849]  Enc Vitals Group     BP 129/81     Pulse Rate 81     Resp 16     Temp 98.6 F (37 C)     Temp Source Oral     SpO2 100 %     Weight 210 lb (95.3 kg)     Height 5\' 4"  (1.626 m)     Head Circumference      Peak Flow      Pain Score 5     Pain Loc      Pain Edu?      Excl. in GC?     Most recent vital signs: Vitals:   01/15/22 0849  BP: 129/81  Pulse: 81  Resp: 16  Temp: 98.6 F (37 C)  SpO2: 100%    General: Awake, no distress.  CV:  Good peripheral perfusion.  Resp:  Normal effort.  Abd:  No distention.  Other:     ED Results / Procedures / Treatments  Labs (all labs ordered are listed, but only abnormal results are displayed) Labs Reviewed  COMPREHENSIVE METABOLIC PANEL - Abnormal; Notable for the following components:      Result Value   Glucose, Bld 111 (*)    All other components within normal limits  CBC - Abnormal; Notable for the following components:   WBC 11.5 (*)    RBC 5.17 (*)    Hemoglobin 15.6 (*)    All other components within normal limits  URINALYSIS, ROUTINE W REFLEX MICROSCOPIC - Abnormal; Notable for  the following components:   Color, Urine AMBER (*)    APPearance CLOUDY (*)    Hgb urine dipstick LARGE (*)    Ketones, ur 80 (*)    Protein, ur 30 (*)    All other components within normal limits  LIPASE, BLOOD  POC URINE PREG, ED     EKG     RADIOLOGY   PROCEDURES:     MEDICATIONS ORDERED IN ED: Medications  ondansetron (ZOFRAN-ODT) disintegrating tablet 4 mg (4 mg Oral Given 01/15/22 0853)     IMPRESSION / MDM / ASSESSMENT AND PLAN / ED COURSE  I reviewed the triage vital signs and the nursing notes.                              Differential diagnosis includes, but is not limited to acute infectious gastroenteritis possibly complicated by kidney injury and electrolyte derangements.  CBC shows WBC count of 11.5 but no acute anemia.  There is no focal tenderness or  history of abdominal pain or fever to suggest an appendicitis, torsion, diverticulitis or cholecystitis at this time.  CMP shows no significant electrolyte or metabolic arrangements.  No evidence of hepatitis or cholestatic process.  Lipase not consistent with pancreatitis.  Urine pregnancy test is negative.  UA has some blood and ketones but otherwise no evidence of infection.  Lower suspicion for kidney stone at this time given she describes achiness bilateral back pain and is not colicky and overall presentation is much more consistent with acute infectious gastroenteritis.  Patient did receive some Zofran in triage and subsequently was able to tolerate p.o.  She does not appear septic or toxic and given she is now able to orally rehydrate I do not believe she requires further diagnostic testing or IV hydration.  Will prescribe a course of Zofran.  I will wait at this point patient is stable for discharge to outpatient follow-up.  Discharged in stable condition.  Strict return precautions advised and discussed.      FINAL CLINICAL IMPRESSION(S) / ED DIAGNOSES   Final diagnoses:  Gastroenteritis     Rx /  DC Orders   ED Discharge Orders          Ordered    ondansetron (ZOFRAN-ODT) 4 MG disintegrating tablet  Every 8 hours PRN        01/15/22 1139             Note:  This document was prepared using Dragon voice recognition software and may include unintentional dictation errors.   Gilles Chiquito, MD 01/15/22 [phone]

## 2022-01-15 NOTE — ED Triage Notes (Signed)
Pt c/o N/V/D since Wednesday and over the past 24hrs having left flank pain , feeling dizzy and lightheaded, states she has not been able to keep and food or fluids down.
